# Patient Record
Sex: Female | Born: 2000 | Race: Black or African American | Hispanic: No | Marital: Single | Smoking: Current every day smoker
Health system: Southern US, Community
[De-identification: ages and names within clinical notes are randomized; demographics above are authoritative.]

## PROBLEM LIST (undated history)

## (undated) DIAGNOSIS — F32A Depression, unspecified: Secondary | ICD-10-CM

## (undated) DIAGNOSIS — F419 Anxiety disorder, unspecified: Secondary | ICD-10-CM

## (undated) DIAGNOSIS — F329 Major depressive disorder, single episode, unspecified: Secondary | ICD-10-CM

---
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
## 2019-06-09 ENCOUNTER — Encounter: Payer: Self-pay | Admitting: Family

## 2019-07-01 ENCOUNTER — Other Ambulatory Visit: Payer: Self-pay

## 2019-07-01 ENCOUNTER — Ambulatory Visit (INDEPENDENT_AMBULATORY_CARE_PROVIDER_SITE_OTHER): Payer: Managed Care, Other (non HMO) | Admitting: Family

## 2019-07-01 ENCOUNTER — Other Ambulatory Visit (HOSPITAL_COMMUNITY)
Admission: RE | Admit: 2019-07-01 | Discharge: 2019-07-01 | Disposition: A | Discharge status: Home / Self Care | Payer: Managed Care, Other (non HMO) | Source: Ambulatory Visit | Attending: Family | Admitting: Family

## 2019-07-01 ENCOUNTER — Encounter: Payer: Self-pay | Admitting: Family

## 2019-07-01 VITALS — BP 133/77 | HR 69 | Temp 98.5°F | Resp 16 | Wt 249.0 lb

## 2019-07-01 DIAGNOSIS — Z720 Tobacco use: Secondary | ICD-10-CM

## 2019-07-01 DIAGNOSIS — Z7251 High risk heterosexual behavior: Secondary | ICD-10-CM | POA: Insufficient documentation

## 2019-07-01 DIAGNOSIS — Z309 Encounter for contraceptive management, unspecified: Secondary | ICD-10-CM | POA: Diagnosis not present

## 2019-07-01 DIAGNOSIS — Z0000 Encounter for general adult medical examination without abnormal findings: Secondary | ICD-10-CM | POA: Diagnosis not present

## 2019-07-01 DIAGNOSIS — Z23 Encounter for immunization: Secondary | ICD-10-CM

## 2019-07-01 LAB — POC URINALSYSI DIPSTICK (AUTOMATED)
Blood, UA: NEGATIVE
Glucose, UA: NEGATIVE
Ketones, UA: NEGATIVE
Leukocytes, UA: NEGATIVE
Nitrite, UA: NEGATIVE
Protein, UA: POSITIVE — AB
Spec Grav, UA: >=1.03 — AB (ref 1.010–1.025)
Urobilinogen, UA: 1 E.U./dL
pH, UA: 6 (ref 5.0–8.0)

## 2019-07-01 LAB — POCT URINE PREGNANCY: Preg Test, Ur: NEGATIVE

## 2019-07-01 MED ORDER — NORETHIN ACE-ETH ESTRAD-FE 1.5-30 MG-MCG PO TABS: 1.0000 | ORAL_TABLET | Freq: Every day | ORAL | 11 refills | Status: DC

## 2019-07-01 NOTE — Patient Instructions (Signed)
Please work on diet/exercise and weight loss. Begin birth control tonight. It is very important that you quit smoking.

## 2019-07-01 NOTE — Progress Notes (Signed)
Subjective:    Patient ID: Paula Gross, female    DOB: 2001/09/20, 18 y.o.   MRN: [id number]  HPI  Patient is a 18 yr old female who presents today   Patient presents today for complete physical.  Immunizations: tdap due Diet: reports poor diet Reports that she skips breakfast Lunch: mcd[employer] cheesburger and soda Dinner: sometimes cooks at home, some evening snacks 1-2 days  At work she drinks water, frappe from [employer], juice and soda.  Reports 5 cups of juice a day (minute maid fruit punch), soda 3 cups a day  Exercise: no formal exercise   Interested in OCP- has one female partner, uses condoms "sometimes." LMP 6/15-6/19   Wt Readings from Last 3 Encounters:  07/01/19 249 lb (112.9 kg) (>99 %, Z= 2.51)*  08/11/17 256 lb (116.1 kg) (>99 %, Z= 2.49)*  05/09/16 233 lb (105.7 kg) (>99 %, Z= 2.36)*   * Growth percentiles are based on CDC (Girls, 2-20 Years) data.   Depression- patient is being treated by psychiatry and notes that her depression is stable.  Anxiety- fair, still "working on it."     Review of Systems  Constitutional: Negative for unexpected weight change.  HENT: Negative for hearing loss and rhinorrhea.   Respiratory: Negative for cough and shortness of breath.   Cardiovascular: Negative for chest pain.  Genitourinary: Negative for dysuria, frequency, hematuria and menstrual problem.  Musculoskeletal: Negative for arthralgias.  Skin: Negative for rash.  Neurological: Negative for headaches.  Hematological: Negative for adenopathy.    Past Medical History:  Diagnosis Date  . Anxiety   . Depression      Social History   Socioeconomic History  . Marital status: Single    Spouse name: Not on file  . Number of children: Not on file  . Years of education: Not on file  . Highest education level: Not on file  Occupational History  . Not on file  Social Needs  . Financial resource strain: Not on file  . Food insecurity    Worry: Not on  file    Inability: Not on file  . Transportation needs    Medical: Not on file    Non-medical: Not on file  Tobacco Use  . Smoking status: Current Every Day Smoker    Packs/day: 0.25    Types: Cigarettes  . Smokeless tobacco: Never Used  Substance and Sexual Activity  . Alcohol use: No    Alcohol/week: 0.0 standard drinks  . Drug use: No  . Sexual activity: Never  Lifestyle  . Physical activity    Days per week: Not on file    Minutes per session: Not on file  . Stress: Not on file  Relationships  . Social Herbalist on phone: Not on file    Gets together: Not on file    Attends religious service: Not on file    Active member of club or organization: Not on file    Attends meetings of clubs or organizations: Not on file    Relationship status: Not on file  . Intimate partner violence    Fear of current or ex partner: Not on file    Emotionally abused: Not on file    Physically abused: Not on file    Forced sexual activity: Not on file  Other Topics Concern  . Not on file  Social History Narrative   Lives with Mom   Dad ( has "rocky" relationship with her dad  who lives locally)   1 half brother (does not liver with her)   Goes to [employer]- [employer]   Wants to go to college   Dog- mutt   Enjoys reading, television, drawing    No past surgical history on file.  Family History  Problem Relation Age of Onset  . Arthritis Mother   . Diabetes Mother   . Hyperlipidemia Mother   . Hypertension Mother   . Depression Mother   . Arthritis Maternal Grandmother   . Cancer Maternal Grandmother        colon  . Hyperlipidemia Maternal Grandmother     No Known Allergies  Current Outpatient Medications on File Prior to Visit  Medication Sig Dispense Refill  . buPROPion (WELLBUTRIN XL) 150 MG 24 hr tablet Take 1 tablet (150 mg total) by mouth daily. For depression 30 tablet 0  . hydrOXYzine (ATARAX/VISTARIL) 25 MG tablet Take 1 tablet (25 mg total) by  mouth 3 (three) times daily as needed for anxiety. 60 tablet 0  . sertraline (ZOLOFT) 100 MG tablet Take 100 mg by mouth daily.    . traZODone (DESYREL) 50 MG tablet Take 1 tablet (50 mg total) by mouth at bedtime as needed for sleep. 30 tablet 0   No current facility-administered medications on file prior to visit.     BP 133/77 (BP Location: Right Arm, Patient Position: Sitting, Cuff Size: Large)   Pulse 69   Temp 98.5 F (36.9 C) (Oral)   Resp 16   Wt 249 lb (112.9 kg)   SpO2 98%   BMI 41.44 kg/m       Objective:   Physical Exam   Physical Exam  Constitutional: She is oriented to person, place, and time. She appears well-developed and well-nourished. No distress.  HENT:  Head: Normocephalic and atraumatic.  Right Ear: Tympanic membrane and ear canal normal.  Left Ear: Tympanic membrane and ear canal normal.  Mouth/Throat: not examined- pt wearing mask due to COVID-19 pandemic Eyes: Pupils are equal, round, and reactive to light. No scleral icterus.  Neck: Normal range of motion. No thyromegaly present.  Cardiovascular: Normal rate and regular rhythm.   No murmur heard. Pulmonary/Chest: Effort normal and breath sounds normal. No respiratory distress. He has no wheezes. She has no rales. She exhibits no tenderness.  Abdominal: Soft. Bowel sounds are normal. She exhibits no distension and no mass. There is no tenderness. There is no rebound and no guarding.  Musculoskeletal: She exhibits no edema.  Lymphadenopathy:    She has no cervical adenopathy.  Neurological: She is alert and oriented to person, place, and time. She has normal patellar reflexes. She exhibits normal muscle tone. Coordination normal.  Skin: Skin is warm and dry.  Psychiatric: She has a normal mood and affect. Her behavior is normal. Judgment and thought content normal.  Breast/pelvis: deferred         Assessment & Plan:   Preventative care- discussed healthy diet (avoiding sugared beverages, not  skipping meals, increase lean proteins fresh fruits/veggies). Check routine labs as well as GC/Chlamydia and HIV.   Recommended that she add 30 minutes of walking 5 days a week.    Contraceptive management- urine hcg today, if negative start OCP tonight. Also understands that concomitant smoking can increase her stroke risk. Discussed importance of always using condoms.   Tobacco abuse- pt counseled on importance of quitting smoking.         Assessment & Plan:

## 2019-07-02 LAB — URINE CYTOLOGY ANCILLARY ONLY
Chlamydia: POSITIVE — AB
Neisseria Gonorrhea: NEGATIVE

## 2019-07-04 ENCOUNTER — Telehealth: Payer: Self-pay | Admitting: Family

## 2019-07-04 DIAGNOSIS — R809 Proteinuria, unspecified: Secondary | ICD-10-CM

## 2019-07-04 NOTE — Telephone Encounter (Signed)
Please contact pt and let her know that her lab work was positive for chlamydia. I would like her to to come in for nurse visit to receive azithromycin 1000mg  PO x 1. She also should notify her partner(s) so that they can also receive treatment.  I encourage that she use condoms all of the time.    Urine pregnancy test was negative, ok to start oral contraceptive if she has not already.    Note was made of protein in her urine.  I Would like for her to repeat urinalysis when she comes for the nurse visit. I have placed the order.

## 2019-07-06 NOTE — Telephone Encounter (Signed)
Patient was advised of results and scheduled to be here 07-08-19 for nurse visit

## 2019-07-08 ENCOUNTER — Ambulatory Visit: Payer: Managed Care, Other (non HMO)

## 2019-07-08 ENCOUNTER — Other Ambulatory Visit: Payer: Self-pay

## 2019-07-08 DIAGNOSIS — A749 Chlamydial infection, unspecified: Secondary | ICD-10-CM

## 2019-07-08 MED ORDER — AZITHROMYCIN 250 MG PO TABS: 500.0000 mg | ORAL_TABLET | Freq: Once | ORAL | Status: DC

## 2019-07-08 NOTE — Progress Notes (Signed)
Patient her today for an oral medication Azythromycin 1000 mg for positive STD, Per Melissa.  Patient swallowed #2,  500mg  tablets with no complications.

## 2019-08-12 ENCOUNTER — Ambulatory Visit (INDEPENDENT_AMBULATORY_CARE_PROVIDER_SITE_OTHER): Payer: Managed Care, Other (non HMO) | Admitting: Medical

## 2019-08-12 ENCOUNTER — Ambulatory Visit (HOSPITAL_COMMUNITY)
Admission: RE | Admit: 2019-08-12 | Discharge: 2019-08-12 | Disposition: A | Discharge status: Home / Self Care | Payer: Managed Care, Other (non HMO) | Attending: Psychiatry | Admitting: Psychiatry

## 2019-08-12 ENCOUNTER — Encounter: Payer: Self-pay | Admitting: Medical

## 2019-08-12 ENCOUNTER — Other Ambulatory Visit: Payer: Self-pay

## 2019-08-12 ENCOUNTER — Telehealth: Payer: Self-pay | Admitting: Family

## 2019-08-12 VITALS — BP 140/79 | Ht 65.0 in | Wt 251.6 lb

## 2019-08-12 DIAGNOSIS — R4586 Emotional lability: Secondary | ICD-10-CM

## 2019-08-12 NOTE — Telephone Encounter (Signed)
Error

## 2019-08-12 NOTE — Progress Notes (Signed)
Subjective:    Patient ID: Paula Gross, female    DOB: [birth year], 18 y.o.   MRN: [id number]  HPI  Virtual Visit via Video Note(initial video visit but then lost audio and finished by phone)  I connected with Dorann LodgeJuanita E Balgobin on 08/12/19 at  2:40 PM EDT by a video enabled telemedicine application and verified that I am speaking with the correct person using two identifiers.  Location: Patient: home Provider: office   Pt did not check vitals today. Pt thinks her mom might have bp cuff. Not sure if blood pressure correct as reading came back 72/57 initially.   I discussed the limitations of evaluation and management by telemedicine and the availability of in person appointments. The patient expressed understanding and agreed to proceed.  History of Present Illness:  Pt has been using ocp recently. Her mood has worsened recently and she has hx of depression. On review of chart in 2018 she had severe depression with suicidal ideation in the past.  Pt states recently started ocp in June. Pt states this is her first time starting with ocp.  Pt states that since then her mood has decreased a lot. She states very sad and crying easily. Started 2-3 days ago. Pt states no particular sad or stressful event that has occurred.  Pt in past pt has been on wellbutrin 150 mg, hydroxzine  and trazadone. Pt states has not seen psychiatrist for past 5 months. Pt states has mild depression about 2 days out of 7 days  for past 6 or more months. Now mood is worsened.  Pt thinks that sertraline helped mood better in the past.  Pt states depression is severe presently. No plans for hurting self but transient thoughts of harm to self. But no thoughts of harm to others. Pt shared with mom last night.      Observations/Objective: General-no acute distress, pleasant, oriented.Does not appear overtly sad during interview Lungs- on inspection lungs appear unlabored. Neck- no tracheal deviation or jvd on  inspection. Neuro- gross motor function appears intact.  Assessment and Plan: You do have history of severe depression in the past with reported mild level depression over the past 6 months.  You have been under psychiatrist care and have done relatively well by your description.  However some concern with drastic mood changes over the past 2 to 3 days.  You associate this with your current contraceptives that you  started about 1 month ago but based on your history I am not convinced this is the cause of mood change.  You reported some transient thoughts of harm to self over the last couple of days but have made no  plans.  I am glad that you informed your mother regarding these thoughts.  We need to follow you closely and make sure that you improved.  I do think your psychiatrist office needs to be fully aware of how you feel.  On review of your medical history and medications that you are on, I think you might need to have incremental increase of your Wellbutrin or you might need to add on low-dose sertraline and as you report that that did help you in the past.  However I think it is most appropriate that psychiatrist make this decision.  In addition psychiatrist might see you tomorrow under recent circumstances.  You authorized me to discuss the above with your mother today.  If you have persistent thoughts of harming your self or you start to formulate  plans then strongly recommend/advised that you be seen at [facility] emergency department for further evaluation.  I will update your PCP on your recent change of mood.  We will see if she wants to change your contraceptive.  Asked mom and patient to update me tomorrow by my chart or by calling office directly as to whether psychiatrist made medication changes or agreed to see her.  If there is a delay in response from psychiatrist office then might need to make medication change or might need to advise evaluation [facility] ED.  Follow-up date to  be determined depending on psychiatrist response.  Esperanza RichtersEdward Jahrel Borthwick, PA-C  Follow Up Instructions:    I discussed the assessment and treatment plan with the patient. The patient was provided an opportunity to ask questions and all were answered. The patient agreed with the plan and demonstrated an understanding of the instructions.   The patient was advised to call back or seek an in-person evaluation if the symptoms worsen or if the condition fails to improve as anticipated.  I provided  25  + minutes of non-face-to-face time during this encounter.   Esperanza RichtersEdward Cowan Pilar, PA-C   Review of Systems  Constitutional: Negative for chills, fatigue and fever.  Respiratory: Negative for cough, chest tightness, shortness of breath and wheezing.   Cardiovascular: Negative for chest pain and palpitations.  Neurological: Negative for dizziness, weakness and numbness.  Hematological: Negative for adenopathy. Does not bruise/bleed easily.  Psychiatric/Behavioral: Positive for dysphoric mood. Negative for agitation, behavioral problems, self-injury and sleep disturbance. The patient is not hyperactive.        Transient thought of harm to self. See hpi    Past Medical History:  Diagnosis Date  . Anxiety   . Depression      Social History   Socioeconomic History  . Marital status: Single    Spouse name: Not on file  . Number of children: Not on file  . Years of education: Not on file  . Highest education level: Not on file  Occupational History  . Not on file  Social Needs  . Financial resource strain: Not on file  . Food insecurity    Worry: Not on file    Inability: Not on file  . Transportation needs    Medical: Not on file    Non-medical: Not on file  Tobacco Use  . Smoking status: Current Every Day Smoker    Packs/day: 0.25    Types: Cigarettes  . Smokeless tobacco: Never Used  Substance and Sexual Activity  . Alcohol use: No    Alcohol/week: 0.0 standard drinks  . Drug use:  No  . Sexual activity: Never  Lifestyle  . Physical activity    Days per week: Not on file    Minutes per session: Not on file  . Stress: Not on file  Relationships  . Social Musicianconnections    Talks on phone: Not on file    Gets together: Not on file    Attends religious service: Not on file    Active member of club or organization: Not on file    Attends meetings of clubs or organizations: Not on file    Relationship status: Not on file  . Intimate partner violence    Fear of current or ex partner: Not on file    Emotionally abused: Not on file    Physically abused: Not on file    Forced sexual activity: Not on file  Other Topics Concern  .  Not on file  Social History Narrative   Lives with Mom   Dad ( has "rocky" relationship with her dad who lives locally)   1 half brother (does not liver with her)   Goes to [place]   Wants to go to college   Dog- mutt   Enjoys reading, television, drawing    No past surgical history on file.  Family History  Problem Relation Age of Onset  . Arthritis Mother   . Diabetes Mother   . Hyperlipidemia Mother   . Hypertension Mother   . Depression Mother   . Arthritis Maternal Grandmother   . Cancer Maternal Grandmother        colon  . Hyperlipidemia Maternal Grandmother     No Known Allergies  Current Outpatient Medications on File Prior to Visit  Medication Sig Dispense Refill  . buPROPion (WELLBUTRIN XL) 150 MG 24 hr tablet Take 1 tablet (150 mg total) by mouth daily. For depression 30 tablet 0  . hydrOXYzine (ATARAX/VISTARIL) 25 MG tablet Take 1 tablet (25 mg total) by mouth 3 (three) times daily as needed for anxiety. 60 tablet 0  . norethindrone-ethinyl estradiol-iron (MICROGESTIN FE 1.5/30) 1.5-30 MG-MCG tablet Take 1 tablet by mouth daily. 1 Package 11  . sertraline (ZOLOFT) 100 MG tablet Take 100 mg by mouth daily.    . traZODone (DESYREL) 50 MG tablet Take 1 tablet (50 mg total) by mouth at bedtime as  needed for sleep. 30 tablet 0   Current Facility-Administered Medications on File Prior to Visit  Medication Dose Route Frequency Provider Last Rate Last Dose  . azithromycin (ZITHROMAX) tablet 500 mg  500 mg Oral Once Debbrah Alar, NP      . azithromycin (ZITHROMAX) tablet 500 mg  500 mg Oral Once Debbrah Alar, NP        BP 140/79   Ht 5\' 5"  (1.651 m)   Wt 251 lb 9.6 oz (114.1 kg)   BMI 41.87 kg/m       Objective:   Physical Exam        Assessment & Plan:

## 2019-08-12 NOTE — Patient Instructions (Addendum)
You do have history of severe depression in the past with reported mild level depression over the past 6 months.  You have been under psychiatrist care and have done relatively well by your description.  However some concern with drastic mood changes over the past 2 to 3 days.  You associate this with your current contraceptives that you  started about 1 month ago but based on your history I am not convinced this is the cause of mood change.  You reported some transient thoughts of harm to self over the last couple of days but have made no  plans.  I am glad that you informed your mother regarding these thoughts.  We need to follow you closely and make sure that you improved.  I do think your psychiatrist office needs to be fully aware of how you feel.  On review of your medical history and medications that you are on, I think you might need to have incremental increase of your Wellbutrin or you might need to add on low-dose sertraline and as you report that that did help you in the past.  However I think it is most appropriate that psychiatrist make this decision.  In addition psychiatrist might see you tomorrow under recent circumstances.  You authorized me to discuss the above with your mother today.  If you have persistent thoughts of harming your self or you start to formulate plans then strongly recommend/advised that you be seen at [facility] emergency department for further evaluation.  I will update your PCP on your recent change of mood.  We will see if she wants to change your contraceptive.  Asked mom and patient to update me tomorrow by my chart or by calling office directly as to whether psychiatrist made medication changes or agreed to see her.  If there is a delay in response from psychiatrist office then might need to make medication change or might need to advise evaluation [facility] ED.  Follow-up date to be determined depending on psychiatrist response.

## 2019-08-13 ENCOUNTER — Telehealth: Payer: Self-pay | Admitting: Family

## 2019-08-13 NOTE — Telephone Encounter (Signed)
Please contact pt to schedule an OV to discuss her contraception.  We can do a virtual visit if she wants.

## 2019-08-13 NOTE — Telephone Encounter (Signed)
Patient was scheduled to come in tomorrow at 11 am

## 2019-08-14 ENCOUNTER — Encounter: Payer: Self-pay | Admitting: Family

## 2019-08-14 ENCOUNTER — Ambulatory Visit (INDEPENDENT_AMBULATORY_CARE_PROVIDER_SITE_OTHER): Payer: Managed Care, Other (non HMO) | Admitting: Family

## 2019-08-14 ENCOUNTER — Other Ambulatory Visit: Payer: Self-pay

## 2019-08-14 VITALS — BP 126/81 | HR 96 | Temp 96.6°F | Resp 16 | Ht 65.0 in | Wt 252.6 lb

## 2019-08-14 DIAGNOSIS — Z309 Encounter for contraceptive management, unspecified: Secondary | ICD-10-CM

## 2019-08-14 LAB — POCT URINE PREGNANCY: Preg Test, Ur: NEGATIVE

## 2019-08-14 NOTE — Patient Instructions (Signed)
Please schedule your appointment with GYN. Go to ER if you develop recurrent thoughts of hurting yourself or others. Keep your upcoming appointment with psychiatry.

## 2019-08-14 NOTE — Progress Notes (Signed)
Subjective:    Patient ID: Paula Gross, female    DOB: [birth year], 18 y.o.   MRN: [id number]  HPI  Patient is a 18 yr old female who presents today to discuss contraceptive management.  She saw my colleague 2 days ago due to complaint of mood change.  She has a history of anxiety and depression and is followed by psychiatry.  Last visit we prescribed her Microgestin for contraception.  She reports that she waited several weeks before she started it but then began the oral contraceptive.  Near the end of the first week of OCP she developed some anger/depression/easily irritable.  "I just want ot be alone."  She did have some transient thoughts of suicide without plan.  Today she is feeling "a little bit better, still has some anger /emotions but not as extreme as they were.  Denies current thoughts of SI/HI.    She [facility]  She last saw Paula towards the end of last year.  She has an upcoming appointment with her on 9/3.   She is not currently sexually active.  Though she would like ongoing contraception in the case that she does become sexually active in the near future.   Review of Systems See HPI  Past Medical History:  Diagnosis Date  . Anxiety   . Depression      Social History   Socioeconomic History  . Marital status: Single    Spouse name: Not on file  . Number of children: Not on file  . Years of education: Not on file  . Highest education level: Not on file  Occupational History  . Not on file  Social Needs  . Financial resource strain: Not on file  . Food insecurity    Worry: Not on file    Inability: Not on file  . Transportation needs    Medical: Not on file    Non-medical: Not on file  Tobacco Use  . Smoking status: Current Every Day Smoker    Packs/day: 0.25    Types: Cigarettes  . Smokeless tobacco: Never Used  Substance and Sexual Activity  . Alcohol use: No    Alcohol/week: 0.0 standard drinks  . Drug  use: No  . Sexual activity: Never  Lifestyle  . Physical activity    Days per week: Not on file    Minutes per session: Not on file  . Stress: Not on file  Relationships  . Social Musicianconnections    Talks on phone: Not on file    Gets together: Not on file    Attends religious service: Not on file    Active member of club or organization: Not on file    Attends meetings of clubs or organizations: Not on file    Relationship status: Not on file  . Intimate partner violence    Fear of current or ex partner: Not on file    Emotionally abused: Not on file    Physically abused: Not on file    Forced sexual activity: Not on file  Other Topics Concern  . Not on file  Social History Narrative   Lives with Mom   Dad ( has "rocky" relationship with her dad who lives locally)   1 half brother (does not liver with her)   Goes to [employer]   Wants to go to college   Dog- mutt   Enjoys reading, television, drawing    No past surgical history  on file.  Family History  Problem Relation Age of Onset  . Arthritis Mother   . Diabetes Mother   . Hyperlipidemia Mother   . Hypertension Mother   . Depression Mother   . Arthritis Maternal Grandmother   . Cancer Maternal Grandmother        colon  . Hyperlipidemia Maternal Grandmother     No Known Allergies  Current Outpatient Medications on File Prior to Visit  Medication Sig Dispense Refill  . buPROPion (WELLBUTRIN XL) 150 MG 24 hr tablet Take 1 tablet (150 mg total) by mouth daily. For depression 30 tablet 0  . hydrOXYzine (ATARAX/VISTARIL) 25 MG tablet Take 1 tablet (25 mg total) by mouth 3 (three) times daily as needed for anxiety. 60 tablet 0  . norethindrone-ethinyl estradiol-iron (MICROGESTIN FE 1.5/30) 1.5-30 MG-MCG tablet Take 1 tablet by mouth daily. 1 Package 11  . sertraline (ZOLOFT) 100 MG tablet Take 100 mg by mouth daily.    . traZODone (DESYREL) 50 MG tablet Take 1 tablet (50 mg total) by mouth at bedtime  as needed for sleep. 30 tablet 0   Current Facility-Administered Medications on File Prior to Visit  Medication Dose Route Frequency Provider Last Rate Last Dose  . azithromycin (ZITHROMAX) tablet 500 mg  500 mg Oral Once Sandford Craze'Sullivan, Jacorion Klem, NP      . azithromycin (ZITHROMAX) tablet 500 mg  500 mg Oral Once Sandford Craze'Sullivan, Lyda Colcord, NP        BP 126/81 (BP Location: Right Arm, Patient Position: Sitting, Cuff Size: Large)   Pulse 96   Temp (!) 96.6 F (35.9 C) (Temporal)   Resp 16   Ht 5\' 5"  (1.651 m)   Wt 252 lb 9.6 oz (114.6 kg)   SpO2 100%   BMI 42.03 kg/m       Objective:   Physical Exam Constitutional:      General: She is not in acute distress.    Appearance: Normal appearance. She is obese.  Neurological:     Mental Status: She is alert.  Psychiatric:        Attention and Perception: Attention and perception normal.        Mood and Affect: Affect is flat.        Speech: Speech normal.        Behavior: Behavior normal. Behavior is cooperative.        Thought Content: Thought content normal.        Cognition and Memory: Cognition and memory normal.        Judgment: Judgment normal.           Assessment & Plan:  Depression/anxiety-her anxiety seems overall well controlled.  Depression is an ongoing issue but she is doing better today than she was previously.  We discussed at length various contraception options.  We discussed the possibility of discontinuing her oral contraceptive until we can get her into GYN to discuss alternate methods such as an IUD.  I have concerned about giving her Depo  in case this worsens her depression and we cannot undo the effects for 3 months.  She does not wish to discontinue the oral contraceptive at this time until she has another stable form of birth control and place.  We will work to try to get her into GYN as soon as possible.  In the meantime she understands that should she have recurrent thoughts of hurting herself or others she  should call 911 she is advised to keep her upcoming appointment with  psychiatry.  Contraceptive management- refer to GYN.   A total of 15  minutes were spent face-to-face with the patient during this encounter and over half of that time was spent on counseling and coordination of care. The patient was counseled on Depression/anxiety/contraceptive management.

## 2019-09-02 ENCOUNTER — Ambulatory Visit: Payer: Managed Care, Other (non HMO) | Admitting: Advanced Practice Midwife

## 2019-09-04 DIAGNOSIS — F332 Major depressive disorder, recurrent severe without psychotic features: Secondary | ICD-10-CM | POA: Diagnosis not present

## 2019-09-04 DIAGNOSIS — F4010 Social phobia, unspecified: Secondary | ICD-10-CM | POA: Diagnosis not present

## 2019-09-04 DIAGNOSIS — F411 Generalized anxiety disorder: Secondary | ICD-10-CM | POA: Diagnosis not present

## 2019-09-18 ENCOUNTER — Ambulatory Visit: Payer: Managed Care, Other (non HMO)

## 2019-09-30 DIAGNOSIS — F411 Generalized anxiety disorder: Secondary | ICD-10-CM | POA: Diagnosis not present

## 2019-09-30 DIAGNOSIS — F4010 Social phobia, unspecified: Secondary | ICD-10-CM | POA: Diagnosis not present

## 2019-09-30 DIAGNOSIS — F332 Major depressive disorder, recurrent severe without psychotic features: Secondary | ICD-10-CM | POA: Diagnosis not present

## 2019-11-13 ENCOUNTER — Other Ambulatory Visit: Payer: Self-pay

## 2019-11-13 ENCOUNTER — Ambulatory Visit (INDEPENDENT_AMBULATORY_CARE_PROVIDER_SITE_OTHER): Payer: Managed Care, Other (non HMO) | Admitting: Family

## 2019-11-13 DIAGNOSIS — U071 COVID-19: Secondary | ICD-10-CM | POA: Diagnosis not present

## 2019-11-13 NOTE — Progress Notes (Signed)
Virtual Visit via Video Note  I connected with Paula Gross on 11/13/19 at 10:20 AM EST by a video enabled telemedicine application and verified that I am speaking with the correct person using two identifiers.  Location: Patient: home Provider: office   I discussed the limitations of evaluation and management by telemedicine and the availability of in person appointments. The patient expressed understanding and agreed to proceed.  History of Present Illness:  Patient is a 18 yr old female who presents today due to Covid-19 infection.  Reports she went to a urgent care 3 days ago. She was having loss of taste/smell. She now has nasal congestion, mild cough.  Denies sore throat.  No current headaches. Reports low grade temp, no body aches.  + fatigue.  She is short of breath.    Past Medical History:  Diagnosis Date  . Anxiety   . Depression      Social History   Socioeconomic History  . Marital status: Single    Spouse name: Not on file  . Number of children: Not on file  . Years of education: Not on file  . Highest education level: Not on file  Occupational History  . Not on file  Social Needs  . Financial resource strain: Not on file  . Food insecurity    Worry: Not on file    Inability: Not on file  . Transportation needs    Medical: Not on file    Non-medical: Not on file  Tobacco Use  . Smoking status: Current Every Day Smoker    Packs/day: 0.25    Types: Cigarettes  . Smokeless tobacco: Never Used  Substance and Sexual Activity  . Alcohol use: No    Alcohol/week: 0.0 standard drinks  . Drug use: No  . Sexual activity: Never  Lifestyle  . Physical activity    Days per week: Not on file    Minutes per session: Not on file  . Stress: Not on file  Relationships  . Social Musician on phone: Not on file    Gets together: Not on file    Attends religious service: Not on file    Active member of club or organization: Not on file    Attends  meetings of clubs or organizations: Not on file    Relationship status: Not on file  . Intimate partner violence    Fear of current or ex partner: Not on file    Emotionally abused: Not on file    Physically abused: Not on file    Forced sexual activity: Not on file  Other Topics Concern  . Not on file  Social History Narrative   Lives with Mom   Dad ( has "rocky" relationship with her dad who lives locally)   1 half brother (does not liver with her)   Goes to [employer]   Wants to go to college   Dog- mutt   Enjoys reading, television, drawing    No past surgical history on file.  Family History  Problem Relation Age of Onset  . Arthritis Mother   . Diabetes Mother   . Hyperlipidemia Mother   . Hypertension Mother   . Depression Mother   . Arthritis Maternal Grandmother   . Cancer Maternal Grandmother        colon  . Hyperlipidemia Maternal Grandmother     No Known Allergies  Current Outpatient Medications on File Prior to Visit  Medication Sig  Dispense Refill  . buPROPion (WELLBUTRIN XL) 150 MG 24 hr tablet Take 1 tablet (150 mg total) by mouth daily. For depression 30 tablet 0  . hydrOXYzine (ATARAX/VISTARIL) 25 MG tablet Take 1 tablet (25 mg total) by mouth 3 (three) times daily as needed for anxiety. 60 tablet 0  . norethindrone-ethinyl estradiol-iron (MICROGESTIN FE 1.5/30) 1.5-30 MG-MCG tablet Take 1 tablet by mouth daily. 1 Package 11  . sertraline (ZOLOFT) 100 MG tablet Take 100 mg by mouth daily.    . traZODone (DESYREL) 50 MG tablet Take 1 tablet (50 mg total) by mouth at bedtime as needed for sleep. 30 tablet 0   Current Facility-Administered Medications on File Prior to Visit  Medication Dose Route Frequency Provider Last Rate Last Dose  . azithromycin (ZITHROMAX) tablet 500 mg  500 mg Oral Once Debbrah Alar, NP      . azithromycin (ZITHROMAX) tablet 500 mg  500 mg Oral Once Debbrah Alar, NP        There were no vitals  taken for this visit.    Observations/Objective:   Gen: Awake, alert, no acute distress Resp: Breathing is even and non-labored Psych: calm/pleasant demeanor Neuro: Alert and Oriented x 3, + facial symmetry, speech is clear.   Assessment and Plan:  COVID-19 infection-we discussed supportive measures including Tylenol as needed for body aches/fever and Mucinex as needed for cough/congestion.  She is advised to call if symptoms worsen.  She is advised to go to the emergency room should she develop shortness of breath.  We discussed need to quarantine for 10 days from the start of symptoms and needs to be fever free with symptoms improving for at least 24 hrs.  Follow Up Instructions:    I discussed the assessment and treatment plan with the patient. The patient was provided an opportunity to ask questions and all were answered. The patient agreed with the plan and demonstrated an understanding of the instructions.   The patient was advised to call back or seek an in-person evaluation if the symptoms worsen or if the condition fails to improve as anticipated.  Nance Pear, NP

## 2020-03-25 ENCOUNTER — Ambulatory Visit: Payer: Managed Care, Other (non HMO) | Attending: Internal Medicine

## 2020-03-25 DIAGNOSIS — Z23 Encounter for immunization: Secondary | ICD-10-CM

## 2020-03-25 NOTE — Progress Notes (Signed)
   Covid-19 Vaccination Clinic  Name:  Paula Gross    MRN: [id number] DOB: [birth year]  03/25/2020  [name] was observed post Covid-19 immunization for 15 minutes without incident. She was provided with Vaccine Information Sheet and instruction to access the V-Safe system.   [name] was instructed to call 911 with any severe reactions post vaccine: Marland Kitchen Difficulty breathing  . Swelling of face and throat  . A fast heartbeat  . A bad rash all over body  . Dizziness and weakness   Immunizations Administered    Name Date Dose VIS Date Route   Pfizer COVID-19 Vaccine 03/25/2020  2:51 PM 0.3 mL 12/11/2019 Intramuscular   Manufacturer: [employer], [employer]   Lot: [id number]   NDC: [address]-4

## 2020-04-19 ENCOUNTER — Ambulatory Visit: Payer: Managed Care, Other (non HMO) | Attending: Internal Medicine

## 2020-04-19 DIAGNOSIS — Z23 Encounter for immunization: Secondary | ICD-10-CM

## 2020-04-19 NOTE — Progress Notes (Signed)
   Covid-19 Vaccination Clinic  Name:  Paula Gross    MRN: [id number] DOB: [birth year]  04/19/2020  Ms. Wire was observed post Covid-19 immunization for 15 minutes without incident. She was provided with Vaccine Information Sheet and instruction to access the V-Safe system.   [name] was instructed to call 911 with any severe reactions post vaccine: Marland Kitchen Difficulty breathing  . Swelling of face and throat  . A fast heartbeat  . A bad rash all over body  . Dizziness and weakness   Immunizations Administered    Name Date Dose VIS Date Route   Pfizer COVID-19 Vaccine 04/19/2020  9:52 AM 0.3 mL 02/24/2019 Intramuscular   Manufacturer: [employer], [employer]   Lot: [id number]   NDC: [address]-1

## 2020-07-06 ENCOUNTER — Ambulatory Visit (INDEPENDENT_AMBULATORY_CARE_PROVIDER_SITE_OTHER): Payer: Managed Care, Other (non HMO) | Admitting: Family

## 2020-07-06 ENCOUNTER — Encounter: Payer: Self-pay | Admitting: Family

## 2020-07-06 ENCOUNTER — Other Ambulatory Visit: Payer: Self-pay

## 2020-07-06 VITALS — BP 115/77 | HR 90 | Temp 97.7°F | Resp 16 | Ht 65.0 in | Wt 257.0 lb

## 2020-07-06 DIAGNOSIS — Z0000 Encounter for general adult medical examination without abnormal findings: Secondary | ICD-10-CM

## 2020-07-06 MED ORDER — NORETHIN ACE-ETH ESTRAD-FE 1.5-30 MG-MCG PO TABS: 1.0000 | ORAL_TABLET | Freq: Every day | ORAL | 3 refills | Status: DC

## 2020-07-06 NOTE — Progress Notes (Signed)
Subjective:    Patient ID: Paula Gross, female    DOB: [birth year], 19 y.o.   MRN: [id number]  HPI  Patient presents today for complete physical.  Immunizations: up to date Diet: Trying to work on portions  Weight 257.3  Wt Readings from Last 3 Encounters:  07/06/20 257 lb (116.6 kg)  08/14/19 252 lb 9.6 oz (114.6 kg)  08/12/19 251 lb 9.6 oz (114.1 kg)   Exercise: some, not regularly.  She works 6 days a week.  Pap Smear: Dental: due  Vision: last year.   Depression- sees psychiatry.  Not currently on medicine. She stopped the medication.  Reports depression seems stable currently.   Review of Systems  Constitutional: Negative for unexpected weight change.  HENT: Negative for hearing loss and rhinorrhea.   Eyes: Negative for visual disturbance.  Respiratory: Negative for cough.   Cardiovascular: Negative for leg swelling.  Gastrointestinal: Negative for constipation and diarrhea.  Genitourinary: Negative for dysuria and frequency.  Musculoskeletal: Negative for arthralgias and myalgias.  Skin: Negative for rash.  Neurological: Negative for headaches.  Hematological: Negative for adenopathy.   Past Medical History:  Diagnosis Date  . Anxiety   . Depression      Social History   Socioeconomic History  . Marital status: Single    Spouse name: Not on file  . Number of children: Not on file  . Years of education: Not on file  . Highest education level: Not on file  Occupational History  . Not on file  Tobacco Use  . Smoking status: Former Smoker    Packs/day: 0.25    Types: Cigarettes  . Smokeless tobacco: Former Neurosurgeon    Quit date: 2021  Vaping Use  . Vaping Use: Never used  Substance and Sexual Activity  . Alcohol use: No    Alcohol/week: 0.0 standard drinks  . Drug use: No  . Sexual activity: Yes  Other Topics Concern  . Not on file  Social History Narrative   Lives with Mom   Dad ( has "rocky" relationship with her dad who lives locally)   1  half brother (does not liver with her)   Goes to [employer]- Junior   Wants to go to college   Dog- mutt   Enjoys reading, television, drawing   Social Determinants of Corporate investment banker Strain:   . Difficulty of Paying Living Expenses:   Food Insecurity:   . Worried About Programme researcher, broadcasting/film/video in the Last Year:   . Barista in the Last Year:   Transportation Needs:   . Freight forwarder (Medical):   Marland Kitchen Lack of Transportation (Non-Medical):   Physical Activity:   . Days of Exercise per Week:   . Minutes of Exercise per Session:   Stress:   . Feeling of Stress :   Social Connections:   . Frequency of Communication with Friends and Family:   . Frequency of Social Gatherings with Friends and Family:   . Attends Religious Services:   . Active Member of Clubs or Organizations:   . Attends Banker Meetings:   Marland Kitchen Marital Status:   Intimate Partner Violence:   . Fear of Current or Ex-Partner:   . Emotionally Abused:   Marland Kitchen Physically Abused:   . Sexually Abused:     No past surgical history on file.  Family History  Problem Relation Age of Onset  . Arthritis Mother   . Diabetes Mother   .  Hyperlipidemia Mother   . Hypertension Mother   . Depression Mother   . Arthritis Maternal Grandmother   . Cancer Maternal Grandmother        colon  . Hyperlipidemia Maternal Grandmother     No Known Allergies  Current Outpatient Medications on File Prior to Visit  Medication Sig Dispense Refill  . norethindrone-ethinyl estradiol-iron (MICROGESTIN FE 1.5/30) 1.5-30 MG-MCG tablet Take 1 tablet by mouth daily. 1 Package 11   No current facility-administered medications on file prior to visit.    BP 115/77 (BP Location: Right Arm, Patient Position: Sitting, Cuff Size: Large)   Pulse 90   Temp 97.7 F (36.5 C) (Temporal)   Resp 16   Ht 5\' 5"  (1.651 m)   Wt 257 lb (116.6 kg)   LMP  (LMP Unknown) Comment: on "continuous bc"  SpO2 100%   BMI  42.77 kg/m       Objective:   Physical Exam Physical Exam  Constitutional: She is oriented to person, place, and time. She appears well-developed and well-nourished. No distress.  HENT:  Head: Normocephalic and atraumatic.  Right Ear: Tympanic membrane and ear canal normal.  Left Ear: Tympanic membrane and ear canal normal.  Mouth/Throat: Not examined- pt wearing mask Eyes: Pupils are equal, round, and reactive to light. No scleral icterus.  Neck: Normal range of motion. No thyromegaly present.  Cardiovascular: Normal rate and regular rhythm.   No murmur heard. Pulmonary/Chest: Effort normal and breath sounds normal. No respiratory distress. He has no wheezes. She has no rales. She exhibits no tenderness.  Abdominal: Soft. Bowel sounds are normal. She exhibits no distension and no mass. There is no tenderness. There is no rebound and no guarding.  Musculoskeletal: She exhibits no edema.  Lymphadenopathy:    She has no cervical adenopathy.  Neurological: She is alert and oriented to person, place, and time. She has normal patellar reflexes. She exhibits normal muscle tone. Coordination normal.  Skin: Skin is warm and dry.  Psychiatric: She has a normal mood and affect. Her behavior is normal. Judgment and thought content normal.  Breasts/pelvic: deferred           Assessment & Plan:  Preventative care- discussed diet, exercise and weight loss.  She wishes to complete Pap with her GYN.  Immunizations reviewed and up to date.  Will obtain routine lab work.   This visit occurred during the SARS-CoV-2 public health emergency.  Safety protocols were in place, including screening questions prior to the visit, additional usage of staff PPE, and extensive cleaning of exam room while observing appropriate contact time as indicated for disinfecting solutions.         Assessment & Plan:

## 2020-07-06 NOTE — Patient Instructions (Addendum)
Please follow up with psychiatry.   Please work on adding regular exercise, portion control and weight loss.    Preventive Care 59-21 Years Old, Female Preventive care refers to lifestyle choices and visits with your health care provider that can promote health and wellness. At this stage in your life, you may start seeing a primary care physician instead of a pediatrician. Your health care is now your responsibility. Preventive care for young adults includes:  A yearly physical exam. This is also called an annual wellness visit.  Regular dental and eye exams.  Immunizations.  Screening for certain conditions.  Healthy lifestyle choices, such as diet and exercise. What can I expect for my preventive care visit? Physical exam Your health care provider may check:  Height and weight. These may be used to calculate body mass index (BMI), which is a measurement that tells if you are at a healthy weight.  Heart rate and blood pressure.  Body temperature. Counseling Your health care provider may ask you questions about:  Past medical problems and family medical history.  Alcohol, tobacco, and drug use.  Home and relationship well-being.  Access to firearms.  Emotional well-being.  Diet, exercise, and sleep habits.  Sexual activity and sexual health.  Method of birth control.  Menstrual cycle.  Pregnancy history. What immunizations do I need?  Influenza (flu) vaccine  This is recommended every year. Tetanus, diphtheria, and pertussis (Tdap) vaccine  You may need a Td booster every 10 years. Varicella (chickenpox) vaccine  You may need this vaccine if you have not already been vaccinated. Human papillomavirus (HPV) vaccine  If recommended by your health care provider, you may need three doses over 6 months. Measles, mumps, and rubella (MMR) vaccine  You may need at least one dose of MMR. You may also need a second dose. Meningococcal conjugate (MenACWY)  vaccine  One dose is recommended if you are 18-105 years old and a Market researcher living in a residence hall, or if you have one of several medical conditions. You may also need additional booster doses. Pneumococcal conjugate (PCV13) vaccine  You may need this if you have certain conditions and were not previously vaccinated. Pneumococcal polysaccharide (PPSV23) vaccine  You may need one or two doses if you smoke cigarettes or if you have certain conditions. Hepatitis A vaccine  You may need this if you have certain conditions or if you travel or work in places where you may be exposed to hepatitis A. Hepatitis B vaccine  You may need this if you have certain conditions or if you travel or work in places where you may be exposed to hepatitis B. Haemophilus influenzae type b (Hib) vaccine  You may need this if you have certain risk factors. You may receive vaccines as individual doses or as more than one vaccine together in one shot (combination vaccines). Talk with your health care provider about the risks and benefits of combination vaccines. What tests do I need? Blood tests  Lipid and cholesterol levels. These may be checked every 5 years starting at age 29.  Hepatitis C test.  Hepatitis B test. Screening  Pelvic exam and Pap test. This may be done every 3 years starting at age 33.  Sexually transmitted disease (STD) testing, if you are at risk.  BRCA-related cancer screening. This may be done if you have a family history of breast, ovarian, tubal, or peritoneal cancers. Other tests  Tuberculosis skin test.  Vision and hearing tests.  Skin exam.  Breast exam. Follow these instructions at home: Eating and drinking   Eat a diet that includes fresh fruits and vegetables, whole grains, lean protein, and low-fat dairy products.  Drink enough fluid to keep your urine pale yellow.  Do not drink alcohol if: ? Your health care provider tells you not to  drink. ? You are pregnant, may be pregnant, or are planning to become pregnant. ? You are under the legal drinking age. In the [place] the legal drinking age is 38.  If you drink alcohol: ? Limit how much you have to 0-1 drink a day. ? Be aware of how much alcohol is in your drink. In the [place] one drink equals one 12 oz bottle of beer (355 mL), one 5 oz glass of wine (148 mL), or one 1 oz glass of hard liquor (44 mL). Lifestyle  Take daily care of your teeth and gums.  Stay active. Exercise at least 30 minutes 5 or more days of the week.  Do not use any products that contain nicotine or tobacco, such as cigarettes, e-cigarettes, and chewing tobacco. If you need help quitting, ask your health care provider.  Do not use drugs.  If you are sexually active, practice safe sex. Use a condom or other form of birth control (contraception) in order to prevent pregnancy and STIs (sexually transmitted infections). If you plan to become pregnant, see your health care provider for a pre-conception visit.  Find healthy ways to cope with stress, such as: ? Meditation, yoga, or listening to music. ? Journaling. ? Talking to a trusted person. ? Spending time with friends and family. Safety  Always wear your seat belt while driving or riding in a vehicle.  Do not drive if you have been drinking alcohol. Do not ride with someone who has been drinking.  Do not drive when you are tired or distracted. Do not text while driving.  Wear a helmet and other protective equipment during sports activities.  If you have firearms in your house, make sure you follow all gun safety procedures.  Seek help if you have been bullied, physically abused, or sexually abused.  Use the Internet responsibly to avoid dangers such as online bullying and online sex predators. What's next?  Go to your health care provider once a year for a well check visit.  Ask your health care provider how often you should have your  eyes and teeth checked.  Stay up to date on all vaccines. This information is not intended to replace advice given to you by your health care provider. Make sure you discuss any questions you have with your health care provider. Document Revised: 12/11/2018 Document Reviewed: 12/11/2018 Elsevier Patient Education  2020 [employer].

## 2021-02-28 HISTORY — PX: OTHER SURGICAL HISTORY: SHX169

## 2021-06-13 ENCOUNTER — Other Ambulatory Visit: Payer: Self-pay | Admitting: Family

## 2021-07-10 ENCOUNTER — Encounter: Payer: Managed Care, Other (non HMO) | Admitting: Family

## 2021-07-11 ENCOUNTER — Ambulatory Visit (INDEPENDENT_AMBULATORY_CARE_PROVIDER_SITE_OTHER): Payer: Managed Care, Other (non HMO) | Admitting: Family

## 2021-07-11 ENCOUNTER — Other Ambulatory Visit: Payer: Self-pay

## 2021-07-11 ENCOUNTER — Encounter: Payer: Self-pay | Admitting: Family

## 2021-07-11 VITALS — BP 116/80 | HR 89 | Temp 98.8°F | Resp 18 | Ht 65.0 in | Wt 261.2 lb

## 2021-07-11 DIAGNOSIS — F332 Major depressive disorder, recurrent severe without psychotic features: Secondary | ICD-10-CM

## 2021-07-11 DIAGNOSIS — R110 Nausea: Secondary | ICD-10-CM | POA: Diagnosis not present

## 2021-07-11 DIAGNOSIS — Z0000 Encounter for general adult medical examination without abnormal findings: Secondary | ICD-10-CM | POA: Diagnosis not present

## 2021-07-11 DIAGNOSIS — Z309 Encounter for contraceptive management, unspecified: Secondary | ICD-10-CM | POA: Insufficient documentation

## 2021-07-11 MED ORDER — NORETHIN ACE-ETH ESTRAD-FE 1.5-30 MG-MCG PO TABS: 1.0000 | ORAL_TABLET | Freq: Every day | ORAL | 2 refills | Status: DC

## 2021-07-11 MED ORDER — OMEPRAZOLE 40 MG PO CPDR
40.0000 mg | DELAYED_RELEASE_CAPSULE | Freq: Every day | ORAL | 3 refills | Status: DC
Start: 2021-07-11 — End: 2021-11-20

## 2021-07-11 NOTE — Assessment & Plan Note (Addendum)
Wt Readings from Last 3 Encounters:  07/11/21 261 lb 3.2 oz (118.5 kg)  07/06/20 257 lb (116.6 kg)  08/14/19 252 lb 9.6 oz (114.6 kg)   Discussed healthy diet, exercise and weight loss. Pap will be done with GYN.

## 2021-07-11 NOTE — Patient Instructions (Addendum)
Work on cutting out juice, adding regular exercise such as walking 30 minutes 5 days a week. Keep your appointment with the GYN for pap. Start omeprazole 40mg  once daily (antacid medication) Please schedule an appointment with a psychiatrist. See some numbers below.   Psychiatric Services:  [facility]) - [phone] [facility] [place]) - [phone] Dr. [phone] [facility]) [phone] [facility] [place]) [phone] [facility] [place]) - [phone] [facility]) - [phone] [facility], [address] [name], [place]

## 2021-07-11 NOTE — Progress Notes (Signed)
Subjective:   By signing my name below, I, Paula Gross, attest that this documentation has been prepared under the direction and in the presence of Paula Alar NP. 07/11/2021     Patient ID: Paula Gross, female    DOB: 04-Oct-2001, 20 y.o.   MRN: [id number]  Chief Complaint  Patient presents with   Annual Exam    Pt states not fasting     HPI Patient is in today for a comprehensive physical exam.  She denies having any unexpected weight change, hearing loss and rhinorrhea, visual disturbance, cough, chest pain and leg swelling, vomiting, diarrhea and blood in stool, or dysuria and frequency, for myalgias and arthralgias, rash, headaches, adenopathy, depression or anxiety at this time. She has no recent changes to her family medical history. She does not know any medical history for her fathers parents. She does not drink alcohol. She does not use drugs. She occasionally smokes cigarettes 2x weekly. She does smokes vapes daily. She is currently sexually active with a female partner.   Abdominal pain- She complains of occasional stomach pain with nausea. She denies having any pain under her ribs. She still has her gallbladder at this time.  Anxiety/Depression- She stopped working with a psychiatrist at this time. She reports that her mood is up and down recently. She gets angrier more often and has to actively try to calm herself down. She does not take any medication to manage her symptoms at this time.  Immunizations: She has 3 pfizer Covid-19 vaccines at this time. She did not bring her vaccine card on her during this visit. She is UTD on tetanus vaccines. She is UTD on HPV vaccines. She is not interested in HIV or hepatitis screening at this time. Diet: She reduces her eating portions. She eats at least twice a day. Her breakfast is typically leftovers from the previous day. She occasionally eats take-out food. She does eat snack in between meals. She mainly drinks water and  occasionally drinks soda.  Exercise: She started exercising the first 2 weeks of June but has stopped since then. She is struggling to get back into exercise. She plans to start walking in the morning with her mother.  Pap Smear- She has not yet completed a pap smear. She has an upcoming appointment with her GYN and plans to get the procedure there. Vision: She is UTD on vision care. She recently had a procedure done on her left eye in March, 2022.   Health Maintenance Due  Topic Date Due   Pneumococcal Vaccine 6-94 Years old (1 - PCV) Never done   CHLAMYDIA SCREENING  06/30/2020   PAP-Cervical Cytology Screening  Never done   PAP SMEAR-Modifier  Never done   COVID-19 Vaccine (3 - Booster for Pfizer series) 09/19/2020    Past Medical History:  Diagnosis Date   Anxiety    Depression     Past Surgical History:  Procedure Laterality Date   right eye sugery Right 02/2021    Family History  Problem Relation Age of Onset   Arthritis Mother    Diabetes Mother    Hyperlipidemia Mother    Hypertension Mother    Depression Mother    Arthritis Maternal Grandmother    Cancer Maternal Grandmother        colon   Hyperlipidemia Maternal Grandmother     Social History   Socioeconomic History   Marital status: Single    Spouse name: Not on file   Number of children:  Not on file   Years of education: Not on file   Highest education level: Not on file  Occupational History   Not on file  Tobacco Use   Smoking status: Some Days    Pack years: 0.00    Types: Cigarettes   Smokeless tobacco: Former    Quit date: 2021  Vaping Use   Vaping Use: Every day  Substance and Sexual Activity   Alcohol use: No    Alcohol/week: 0.0 standard drinks   Drug use: No   Sexual activity: Yes    Partners: Male  Other Topics Concern   Not on file  Social History Narrative   Lives with Mom   Dad ( has "rocky" relationship with her dad who lives locally)   1 half brother (does not liver with  her)   Goes to [employer]- Junior   Wants to go to college   Dog- mutt   Enjoys reading, television, drawing   Social Determinants of Radio broadcast assistant Strain: Not on file  Food Insecurity: Not on file  Transportation Needs: Not on file  Physical Activity: Not on file  Stress: Not on file  Social Connections: Not on file  Intimate Partner Violence: Not on file    Outpatient Medications Prior to Visit  Medication Sig Dispense Refill   AUROVELA FE 1.5/30 1.5-30 MG-MCG tablet TAKE 1 TABLET BY MOUTH DAILY 84 tablet 1   No facility-administered medications prior to visit.    No Known Allergies  Review of Systems  Constitutional:        (-)unexpected weight change (-)Adenopathy  HENT:  Negative for hearing loss.        (-)Rhinorrhea   Eyes:        (-)Visual disturbance  Respiratory:  Negative for cough.   Cardiovascular:  Negative for chest pain and leg swelling.  Gastrointestinal:  Positive for abdominal pain (Occasional) and nausea (with abdominal pain). Negative for blood in stool, constipation, diarrhea and vomiting.  Genitourinary:  Negative for dysuria and frequency.  Musculoskeletal:  Negative for joint pain and myalgias.  Skin:  Negative for rash.  Neurological:  Negative for headaches.  Psychiatric/Behavioral:  Negative for depression. The patient is not nervous/anxious.       Objective:    Physical Exam Constitutional:      General: She is not in acute distress.    Appearance: Normal appearance. She is not ill-appearing.  HENT:     Head: Normocephalic and atraumatic.     Right Ear: Tympanic membrane, ear canal and external ear normal.     Left Ear: Tympanic membrane, ear canal and external ear normal.  Eyes:     Extraocular Movements: Extraocular movements intact.     Pupils: Pupils are equal, round, and reactive to light.     Comments: No nystagmus  Cardiovascular:     Rate and Rhythm: Normal rate and regular rhythm.     Pulses:  Normal pulses.     Heart sounds: Normal heart sounds. No murmur heard.   No gallop.  Pulmonary:     Effort: Pulmonary effort is normal. No respiratory distress.     Breath sounds: Normal breath sounds. No wheezing, rhonchi or rales.  Abdominal:     General: Bowel sounds are normal. There is no distension.     Palpations: Abdomen is soft. There is no mass.     Tenderness: There is no abdominal tenderness. There is no guarding or rebound.  Hernia: No hernia is present.  Musculoskeletal:     Comments: 5/5 strength in both upper and lower extremities  Lymphadenopathy:     Cervical: No cervical adenopathy.  Skin:    General: Skin is warm and dry.  Neurological:     Mental Status: She is alert and oriented to person, place, and time.     Deep Tendon Reflexes:     Reflex Scores:      Patellar reflexes are 1+ on the right side and 1+ on the left side. Psychiatric:        Behavior: Behavior normal.    BP 116/80 (BP Location: Left Arm, Patient Position: Sitting, Cuff Size: Normal)   Pulse 89   Temp 98.8 F (37.1 C) (Oral)   Resp 18   Ht _0  (1.651 m)   Wt 261 lb 3.2 oz (118.5 kg)   SpO2 98%   BMI 43.47 kg/m  Wt Readings from Last 3 Encounters:  07/11/21 261 lb 3.2 oz (118.5 kg)  07/06/20 257 lb (116.6 kg)  08/14/19 252 lb 9.6 oz (114.6 kg)       Assessment & Plan:   Problem List Items Addressed This Visit       Unprioritized   Preventative health care - Primary    Wt Readings from Last 3 Encounters:  07/11/21 261 lb 3.2 oz (118.5 kg)  07/06/20 257 lb (116.6 kg)  08/14/19 252 lb 9.6 oz (114.6 kg)  Discussed healthy diet, exercise and weight loss. Pap will be done with GYN.         Nausea    Suspect GERD related symptoms.  Trial of omeprazole 42m once daily. Check H pylori Breath Test.        Relevant Orders   H. pylori breath test   Comp Met (CMET)   Major depressive disorder, recurrent severe without psychotic features ([place]    Uncontrolled.  Given her  hx, I recommended that she get established with psychiatry and I gave her some numbers to call to get an appointment scheduled.          Meds ordered this encounter  Medications   norethindrone-ethinyl estradiol-iron (AUROVELA FE 1.5/30) 1.5-30 MG-MCG tablet    Sig: Take 1 tablet by mouth daily.    Dispense:  84 tablet    Refill:  2   omeprazole (PRILOSEC) 40 MG capsule    Sig: Take 1 capsule (40 mg total) by mouth daily.    Dispense:  30 capsule    Refill:  3    Order Specific Question:   Supervising Provider    Answer:   BPenni HomansA [[id number]]    I, MDebbrah AlarNP, personally preformed the services described in this documentation.  All medical record entries made by the scribe were at my direction and in my presence.  I have reviewed the chart and discharge instructions (if applicable) and agree that the record reflects my personal performance and is accurate and complete. 07/11/2021   I,Paula Gross,acting as a sEducation administratorfor MNance Pear NP.,have documented all relevant documentation on the behalf of MNance Pear NP,as directed by  MNance Pear NP while in the presence of MNance Pear NP.   MNance Pear NP

## 2021-07-12 DIAGNOSIS — R110 Nausea: Secondary | ICD-10-CM | POA: Insufficient documentation

## 2021-07-12 NOTE — Assessment & Plan Note (Signed)
Suspect GERD related symptoms.  Trial of omeprazole 40mg  once daily. Check H pylori Breath Test.

## 2021-07-12 NOTE — Assessment & Plan Note (Signed)
Uncontrolled.  Given her hx, I recommended that she get established with psychiatry and I gave her some numbers to call to get an appointment scheduled.

## 2021-07-17 LAB — HM PAP SMEAR: HM Pap smear: NEGATIVE

## 2021-08-09 ENCOUNTER — Other Ambulatory Visit: Payer: Self-pay

## 2021-08-09 ENCOUNTER — Ambulatory Visit (INDEPENDENT_AMBULATORY_CARE_PROVIDER_SITE_OTHER): Payer: Managed Care, Other (non HMO) | Admitting: Family

## 2021-08-09 VITALS — BP 127/76 | HR 99 | Temp 98.5°F | Resp 16 | Ht 60.0 in | Wt 250.0 lb

## 2021-08-09 DIAGNOSIS — R059 Cough, unspecified: Secondary | ICD-10-CM | POA: Diagnosis not present

## 2021-08-09 DIAGNOSIS — R110 Nausea: Secondary | ICD-10-CM | POA: Diagnosis not present

## 2021-08-09 DIAGNOSIS — F332 Major depressive disorder, recurrent severe without psychotic features: Secondary | ICD-10-CM | POA: Diagnosis not present

## 2021-08-09 LAB — COMPREHENSIVE METABOLIC PANEL
ALT: 16 U/L (ref 0–35)
AST: 18 U/L (ref 0–37)
Albumin: 4.1 g/dL (ref 3.5–5.2)
Alkaline Phosphatase: 37 U/L — ABNORMAL LOW (ref 39–117)
BUN: 12 mg/dL (ref 6–23)
CO2: 22 mEq/L (ref 19–32)
Calcium: 9.8 mg/dL (ref 8.4–10.5)
Chloride: 104 mEq/L (ref 96–112)
Creatinine, Ser: 0.93 mg/dL (ref 0.40–1.20)
GFR: 87.51 mL/min (ref >60.00)
Glucose, Bld: 79 mg/dL (ref 70–99)
Potassium: 3.9 mEq/L (ref 3.5–5.1)
Sodium: 138 mEq/L (ref 135–145)
Total Bilirubin: 0.3 mg/dL (ref 0.2–1.2)
Total Protein: 7.6 g/dL (ref 6.0–8.3)

## 2021-08-09 NOTE — Assessment & Plan Note (Signed)
Seems improved. She is pleased that she is getting out of the house more.  I still think it would be good for her to establish with psychiatry and a therapist and she plans to look into this.

## 2021-08-09 NOTE — Assessment & Plan Note (Signed)
?   Smoker's cough, ? gerd related.  Encouraged pt to work on quitting smoking, continue omeprazole 40mg  once daily. Call if cough worsens or if it does not improve.

## 2021-08-09 NOTE — Patient Instructions (Signed)
Please call to schedule an appointment with psychiatry and a counselor.  Please continue omeprazole.  Complete lab work prior to leaving.

## 2021-08-09 NOTE — Progress Notes (Signed)
Subjective:   By signing my name below, I, Zite Okoli, attest that this documentation has been prepared under the direction and in the presence of Debbrah Alar, NP 08/09/2021   Patient ID: Paula Gross, female    DOB: 12/10/2001, 20 y.o.   MRN: [id number]  No chief complaint on file.   HPI Patient is in today for an office visit.  Cough- She mentions having a dry, persistent cough for the past 2-3 months. She denies wheezing and mentions the cough is productive sometimes. She is actively trying to reduce smoking.  Abdominal Pain- At her last visit, she complained of stomach pain and nausea and was prescribed 40 mg omeprazole PO daily. She reports the pain has reduced and become infrequent. She also mentions that she experiences nausea often after she eats and at occasional times.   Anxiety/Depression- She mentions her mood has been alright and her anger episodes have improved because she started going out more. She is still hesitant on going to see a psychiatrist.   Past Medical History:  Diagnosis Date   Anxiety    Depression     Past Surgical History:  Procedure Laterality Date   right eye sugery Right 02/2021    Family History  Problem Relation Age of Onset   Arthritis Mother    Diabetes Mother    Hyperlipidemia Mother    Hypertension Mother    Depression Mother    Arthritis Maternal Grandmother    Cancer Maternal Grandmother        colon   Hyperlipidemia Maternal Grandmother     Social History   Socioeconomic History   Marital status: Single    Spouse name: Not on file   Number of children: Not on file   Years of education: Not on file   Highest education level: Not on file  Occupational History   Not on file  Tobacco Use   Smoking status: Some Days    Types: Cigarettes   Smokeless tobacco: Former    Quit date: 2021  Vaping Use   Vaping Use: Every day  Substance and Sexual Activity   Alcohol use: No    Alcohol/week: 0.0 standard drinks    Drug use: No   Sexual activity: Yes    Partners: Male  Other Topics Concern   Not on file  Social History Narrative   Lives with Mom   Dad ( has "rocky" relationship with her dad who lives locally)   35 half brother (does not liver with her)   Goes to [employer]- [employer]   Wants to go to college   Dog- mutt   Enjoys reading, television, drawing   Social Determinants of Radio broadcast assistant Strain: Not on file  Food Insecurity: Not on file  Transportation Needs: Not on file  Physical Activity: Not on file  Stress: Not on file  Social Connections: Not on file  Intimate Partner Violence: Not on file    Outpatient Medications Prior to Visit  Medication Sig Dispense Refill   norethindrone-ethinyl estradiol-iron (AUROVELA FE 1.5/30) 1.5-30 MG-MCG tablet Take 1 tablet by mouth daily. 84 tablet 2   omeprazole (PRILOSEC) 40 MG capsule Take 1 capsule (40 mg total) by mouth daily. 30 capsule 3   No facility-administered medications prior to visit.    No Known Allergies  Review of Systems  Respiratory:  Positive for cough.   Gastrointestinal:  Positive for abdominal pain and nausea.      Objective:  Physical Exam Constitutional:      General: She is not in acute distress.    Appearance: Normal appearance. She is not ill-appearing.  HENT:     Head: Normocephalic and atraumatic.     Right Ear: External ear normal.     Left Ear: External ear normal.  Eyes:     Extraocular Movements: Extraocular movements intact.     Pupils: Pupils are equal, round, and reactive to light.  Cardiovascular:     Rate and Rhythm: Normal rate and regular rhythm.     Pulses: Normal pulses.     Heart sounds: Normal heart sounds. No murmur heard. Pulmonary:     Effort: Pulmonary effort is normal. No respiratory distress.     Breath sounds: Normal breath sounds. No wheezing or rhonchi.  Abdominal:     General: Bowel sounds are normal. There is no distension.     Palpations:  Abdomen is soft.     Tenderness: There is no abdominal tenderness. There is no guarding or rebound.  Musculoskeletal:     Cervical back: Neck supple.  Lymphadenopathy:     Cervical: No cervical adenopathy.  Skin:    General: Skin is warm and dry.  Neurological:     Mental Status: She is alert and oriented to person, place, and time.  Psychiatric:        Behavior: Behavior normal.        Judgment: Judgment normal.    BP 127/76 (BP Location: Right Arm, Patient Position: Sitting, Cuff Size: Large)   Pulse 99   Temp 98.5 F (36.9 C) (Oral)   Resp 16   Ht 5' (1.524 m)   Wt 250 lb (113.4 kg)   SpO2 100%   BMI 48.82 kg/m  Wt Readings from Last 3 Encounters:  08/09/21 250 lb (113.4 kg)  07/11/21 261 lb 3.2 oz (118.5 kg)  07/06/20 257 lb (116.6 kg)    Diabetic Foot Exam - Simple   No data filed    Lab Results  Component Value Date   WBC 6.9 08/11/2017   HGB 12.1 08/11/2017   HCT 35.5 (L) 08/11/2017   PLT 317 08/11/2017   GLUCOSE 79 08/09/2021   CHOL 162 08/12/2017   TRIG 125 08/12/2017   HDL 29 (L) 08/12/2017   LDLCALC 108 (H) 08/12/2017   ALT 16 08/09/2021   AST 18 08/09/2021   NA 138 08/09/2021   K 3.9 08/09/2021   CL 104 08/09/2021   CREATININE 0.93 08/09/2021   BUN 12 08/09/2021   CO2 22 08/09/2021   TSH 5.347 (H) 08/12/2017   HGBA1C 6.0 (H) 08/12/2017    Lab Results  Component Value Date   TSH 5.347 (H) 08/12/2017   Lab Results  Component Value Date   WBC 6.9 08/11/2017   HGB 12.1 08/11/2017   HCT 35.5 (L) 08/11/2017   MCV 89.6 08/11/2017   PLT 317 08/11/2017   Lab Results  Component Value Date   NA 138 08/09/2021   K 3.9 08/09/2021   CO2 22 08/09/2021   GLUCOSE 79 08/09/2021   BUN 12 08/09/2021   CREATININE 0.93 08/09/2021   BILITOT 0.3 08/09/2021   ALKPHOS 37 (L) 08/09/2021   AST 18 08/09/2021   ALT 16 08/09/2021   PROT 7.6 08/09/2021   ALBUMIN 4.1 08/09/2021   CALCIUM 9.8 08/09/2021   ANIONGAP 8 08/11/2017   GFR 87.51 08/09/2021    Lab Results  Component Value Date   CHOL 162 08/12/2017   Lab Results  Component Value Date   HDL 29 (L) 08/12/2017   Lab Results  Component Value Date   LDLCALC 108 (H) 08/12/2017   Lab Results  Component Value Date   TRIG 125 08/12/2017   Lab Results  Component Value Date   CHOLHDL 5.6 08/12/2017   Lab Results  Component Value Date   HGBA1C 6.0 (H) 08/12/2017       Assessment & Plan:   Problem List Items Addressed This Visit       Unprioritized   Nausea - Primary    Some improvement on PPI, but still having some persistent nausea. Will obtain cmet and abdominal US to further evaluate her gallbladder.        Relevant Orders   US Abdomen Complete   Comp Met (CMET) (Completed)   Major depressive disorder, recurrent severe without psychotic features ([place])    Seems improved. She is pleased that she is getting out of the house more.  I still think it would be good for her to establish with psychiatry and a therapist and she plans to look into this.        Cough    ? Smoker's cough, ? gerd related.  Encouraged pt to work on quitting smoking, continue omeprazole 67m once daily. Call if cough worsens or if it does not improve.        No orders of the defined types were placed in this encounter.   I,Zite Okoli,acting as a sEducation administratorfor [employer] NP.,have documented all relevant documentation on the behalf of MNance Pear NP,as directed by  MNance Pear NP while in the presence of MNance Pear NP.   I, MDebbrah Alar NP , personally preformed the services described in this documentation.  All medical record entries made by the scribe were at my direction and in my presence.  I have reviewed the chart and discharge instructions (if applicable) and agree that the record reflects my personal performance and is accurate and complete. 08/09/2021

## 2021-08-09 NOTE — Assessment & Plan Note (Signed)
Some improvement on PPI, but still having some persistent nausea. Will obtain cmet and abdominal US to further evaluate her gallbladder.

## 2021-08-16 ENCOUNTER — Other Ambulatory Visit: Payer: Self-pay

## 2021-08-16 ENCOUNTER — Ambulatory Visit (HOSPITAL_BASED_OUTPATIENT_CLINIC_OR_DEPARTMENT_OTHER)
Admission: RE | Admit: 2021-08-16 | Discharge: 2021-08-16 | Disposition: A | Discharge status: Home / Self Care | Payer: Managed Care, Other (non HMO) | Source: Ambulatory Visit | Attending: Family | Admitting: Family

## 2021-08-16 DIAGNOSIS — K769 Liver disease, unspecified: Secondary | ICD-10-CM | POA: Diagnosis not present

## 2021-08-16 DIAGNOSIS — R110 Nausea: Secondary | ICD-10-CM | POA: Diagnosis not present

## 2021-10-27 ENCOUNTER — Ambulatory Visit (INDEPENDENT_AMBULATORY_CARE_PROVIDER_SITE_OTHER): Payer: Managed Care, Other (non HMO) | Admitting: Family

## 2021-10-27 ENCOUNTER — Other Ambulatory Visit: Payer: Self-pay

## 2021-10-27 VITALS — BP 146/90 | HR 80 | Temp 98.4°F | Resp 16 | Wt 255.0 lb

## 2021-10-27 DIAGNOSIS — J039 Acute tonsillitis, unspecified: Secondary | ICD-10-CM | POA: Insufficient documentation

## 2021-10-27 DIAGNOSIS — K219 Gastro-esophageal reflux disease without esophagitis: Secondary | ICD-10-CM

## 2021-10-27 DIAGNOSIS — R030 Elevated blood-pressure reading, without diagnosis of hypertension: Secondary | ICD-10-CM | POA: Diagnosis not present

## 2021-10-27 DIAGNOSIS — F332 Major depressive disorder, recurrent severe without psychotic features: Secondary | ICD-10-CM | POA: Diagnosis not present

## 2021-10-27 DIAGNOSIS — I10 Essential (primary) hypertension: Secondary | ICD-10-CM | POA: Insufficient documentation

## 2021-10-27 NOTE — Assessment & Plan Note (Signed)
Symptoms are improving with augmentin. I advised pt to let us know if her symptoms worsen or if they do not continue to improve.

## 2021-10-27 NOTE — Assessment & Plan Note (Addendum)
BP Readings from Last 3 Encounters:  10/27/21 (!) 146/90  08/09/21 127/76  07/11/21 116/80   Discussed low sodium diet, exercise, weight loss.  Plan to recheck in 1 month.

## 2021-10-27 NOTE — Assessment & Plan Note (Signed)
Stable on omeprazole 40mg once daily. 

## 2021-10-27 NOTE — Assessment & Plan Note (Signed)
Uncontrolled, as well as anxiety.

## 2021-10-27 NOTE — Progress Notes (Signed)
Subjective:   By signing my name below, I, Paula Gross, attest that this documentation has been prepared under the direction and in the presence of Sandford Craze, NP, 10/27/2021   Patient ID: Paula Gross, female    DOB: 02-28-2001, 20 y.o.   MRN: [id number]  Chief Complaint  Patient presents with   Sore Throat    [facility] and is taking    Depression    Has not seen psychiatrist     HPI Patient is in today for an office visit.  Anxiety: She reports a way higher level on anxiety. She contributes this to growing up, Covid-19, and things happening in the world. She was previously working with a psychiatrist that prescribed her medication for her anxiety. She is no longer seeing her psychiatrist and is planning to go back to get back on medication again.  Depression: She notes a steady level of depression.  Tonsillitis: She notes that last week on Friday she began having a sore throat, cough, congestion, headache, body aches, and difficulty sleeping. She went to a health clinic and they diagnosed her with tonsillitis and gave her antibiotics. She tested negative for Covid-19 and strep throat. She complains that it hurts to lay on the right side of her head for extended periods of time. She notes that she has had a decreased appetite and nausea as well with the use of the antibiotics and the onset of her symptoms.  Reflux: She is compliant in taking 40 mg Prilosec to help with her acid reflux symptoms. She reports that she only have 1-2 bad days a week but this is an improvement from the onset of her symptoms.  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    Health Maintenance Due  Topic Date Due   Pneumococcal Vaccine 65-4 Years old (1 - PCV) Never done   COVID-19 Vaccine (3 - Booster for [employer] series) 06/14/2020   CHLAMYDIA SCREENING  06/30/2020   PAP-Cervical Cytology Screening  Never done   PAP SMEAR-Modifier  Never done    INFLUENZA VACCINE  Never done    Past Medical History:  Diagnosis Date   Anxiety    Depression     Past Surgical History:  Procedure Laterality Date   right eye sugery Right 02/2021    Family History  Problem Relation Age of Onset   Arthritis Mother    Diabetes Mother    Hyperlipidemia Mother    Hypertension Mother    Depression Mother    Arthritis Maternal Grandmother    Cancer Maternal Grandmother        colon   Hyperlipidemia Maternal Grandmother     Social History   Socioeconomic History   Marital status: Single    Spouse name: Not on file   Number of children: Not on file   Years of education: Not on file   Highest education level: Not on file  Occupational History   Not on file  Tobacco Use   Smoking status: Some Days    Types: Cigarettes   Smokeless tobacco: Former    Quit date: 2021  Vaping Use   Vaping Use: Every day  Substance and Sexual Activity   Alcohol use: No    Alcohol/week: 0.0 standard drinks   Drug use: No   Sexual activity: Yes    Partners: Male  Other Topics Concern   Not on file  Social History Narrative   Lives with Mom  Dad ( has "rocky" relationship with her dad who lives locally)   1 half brother (does not liver with her)   Goes to [employer]- [employer]   Wants to go to college   Dog- mutt   Enjoys reading, television, drawing   Social Determinants of Corporate investment banker Strain: Not on [employer] Insecurity: Not on file  Transportation Needs: Not on file  Physical Activity: Not on file  Stress: Not on file  Social Connections: Not on file  Intimate Partner Violence: Not on file    Outpatient Medications Prior to Visit  Medication Sig Dispense Refill   amoxicillin-clavulanate (AUGMENTIN) 875-125 MG tablet Take 1 tablet by mouth 2 (two) times daily.     norethindrone-ethinyl estradiol-iron (AUROVELA FE 1.5/30) 1.5-30 MG-MCG tablet Take 1 tablet by mouth daily. 84 tablet 2   omeprazole (PRILOSEC) 40 MG  capsule Take 1 capsule (40 mg total) by mouth daily. 30 capsule 3   No facility-administered medications prior to visit.    No Known Allergies  Review of Systems  Constitutional:        (+) decreased appetite (+) difficulty sleeping  HENT:  Positive for congestion and sore throat.   Respiratory:  Positive for cough.   Gastrointestinal:  Positive for nausea.      Objective:    Physical Exam Constitutional:      General: She is not in acute distress.    Appearance: Normal appearance. She is not ill-appearing.  HENT:     Head: Normocephalic and atraumatic.     Right Ear: External ear normal.     Left Ear: External ear normal.  Eyes:     Extraocular Movements: Extraocular movements intact.     Pupils: Pupils are equal, round, and reactive to light.  Cardiovascular:     Rate and Rhythm: Normal rate and regular rhythm.     Heart sounds: Normal heart sounds. No murmur heard.   No gallop.  Pulmonary:     Effort: Pulmonary effort is normal. No respiratory distress.     Breath sounds: Normal breath sounds. No wheezing or rales.  Lymphadenopathy:     Cervical: No cervical adenopathy.  Skin:    General: Skin is warm and dry.  Neurological:     Mental Status: She is alert and oriented to person, place, and time.  Psychiatric:        Behavior: Behavior normal.        Judgment: Judgment normal.    BP (!) 146/90 (BP Location: Right Arm, Patient Position: Sitting, Cuff Size: Large)   Pulse 80   Temp 98.4 F (36.9 C) (Oral)   Resp 16   Wt 255 lb (115.7 kg)   SpO2 100%   BMI 49.80 kg/m  Wt Readings from Last 3 Encounters:  10/27/21 255 lb (115.7 kg)  08/09/21 250 lb (113.4 kg)  07/11/21 261 lb 3.2 oz (118.5 kg)       Assessment & Plan:   Problem List Items Addressed This Visit       Unprioritized   Tonsillitis    Symptoms are improving with augmentin. I advised pt to let us know if her symptoms worsen or if they do not continue to improve.       Major depressive  disorder, recurrent severe without psychotic features ([facility])    Uncontrolled, as well as anxiety.       GERD (gastroesophageal reflux disease)    Stable on omeprazole 40mg  once daily.  Elevated blood pressure reading - Primary    BP Readings from Last 3 Encounters:  10/27/21 (!) 146/90  08/09/21 127/76  07/11/21 116/80  Discussed low sodium diet, exercise, weight loss.  Plan to recheck in 1 month.       No orders of the defined types were placed in this encounter.   I, Sandford Craze, NP, personally preformed the services described in this documentation.  All medical record entries made by the scribe were at my direction and in my presence.  I have reviewed the chart and discharge instructions (if applicable) and agree that the record reflects my personal performance and is accurate and complete. 10/27/2021  I,Paula Gross,acting as a Neurosurgeon for Lemont Fillers, NP.,have documented all relevant documentation on the behalf of Lemont Fillers, NP,as directed by  Lemont Fillers, NP while in the presence of Lemont Fillers, NP.  Lemont Fillers, NP

## 2021-11-10 ENCOUNTER — Ambulatory Visit: Payer: Managed Care, Other (non HMO) | Admitting: Family

## 2021-11-18 ENCOUNTER — Other Ambulatory Visit: Payer: Self-pay | Admitting: Family

## 2021-11-27 ENCOUNTER — Telehealth: Payer: Self-pay | Admitting: *Deleted

## 2021-11-27 NOTE — Telephone Encounter (Signed)
Patient was rescheduled to 01-08-2022

## 2021-11-27 NOTE — Telephone Encounter (Signed)
Patient Name Paula Gross Patient DOB [birth year] Call Type Message Only Information Provided Reason for Call Request to Reschedule Office Appointment Initial Comment The caller states that she is calling to reschedule an appt. Patient request to speak to RN No Additional Comment The caller declined triage. Disp. Time Disposition Final User 11/26/2021 3:29:56 PM General Information Provided Yes [name]

## 2021-11-29 ENCOUNTER — Ambulatory Visit: Payer: Managed Care, Other (non HMO) | Admitting: Family

## 2022-01-08 ENCOUNTER — Ambulatory Visit: Payer: Managed Care, Other (non HMO) | Admitting: Family

## 2022-02-02 ENCOUNTER — Ambulatory Visit (INDEPENDENT_AMBULATORY_CARE_PROVIDER_SITE_OTHER): Payer: Managed Care, Other (non HMO) | Admitting: Family

## 2022-02-02 DIAGNOSIS — F332 Major depressive disorder, recurrent severe without psychotic features: Secondary | ICD-10-CM

## 2022-02-02 DIAGNOSIS — K219 Gastro-esophageal reflux disease without esophagitis: Secondary | ICD-10-CM | POA: Diagnosis not present

## 2022-02-02 DIAGNOSIS — R030 Elevated blood-pressure reading, without diagnosis of hypertension: Secondary | ICD-10-CM | POA: Diagnosis not present

## 2022-02-02 NOTE — Patient Instructions (Signed)
Please complete lab work prior to leaving.   

## 2022-02-02 NOTE — Assessment & Plan Note (Signed)
Follow up bp is WNL. Encouraged pt to continue her work on diet/exercise/weight loss.

## 2022-02-02 NOTE — Assessment & Plan Note (Addendum)
Fair control. Encouraged her to keep her upcoming appointment with neurology. Gave her information to set up counseling.

## 2022-02-02 NOTE — Assessment & Plan Note (Signed)
Stable on PPI 

## 2022-02-02 NOTE — Progress Notes (Signed)
Subjective:     Patient ID: Paula Gross, female    DOB: 01-Jun-2001, 21 y.o.   MRN: [id number]  Chief Complaint  Patient presents with   Hypertension    Here for follow up BP    HPI Patient is in today for follow up.  HTN- last visit bp was elevated. Working on improving her diet.    BP Readings from Last 3 Encounters:  02/02/22 122/66  10/27/21 (!) 146/90  08/09/21 127/76   Wt Readings from Last 3 Encounters:  02/02/22 263 lb 6.4 oz (119.5 kg)  10/27/21 255 lb (115.7 kg)  08/09/21 250 lb (113.4 kg)   GERD- maintained on omeprazole. Reports symptoms stable.   Anxiety/Depression- Notes anxiety has been worse recently. Depression is some better.  She sees psychiatry- has appointment on 2/20.  [facility].    Health Maintenance Due  Topic Date Due   COVID-19 Vaccine (3 - Booster for Pfizer series) 06/14/2020   CHLAMYDIA SCREENING  06/30/2020   PAP-Cervical Cytology Screening  Never done   PAP SMEAR-Modifier  Never done   INFLUENZA VACCINE  Never done    Past Medical History:  Diagnosis Date   Anxiety    Depression     Past Surgical History:  Procedure Laterality Date   right eye sugery Right 02/2021    Family History  Problem Relation Age of Onset   Arthritis Mother    Diabetes Mother    Hyperlipidemia Mother    Hypertension Mother    Depression Mother    Arthritis Maternal Grandmother    Cancer Maternal Grandmother        colon   Hyperlipidemia Maternal Grandmother     Social History   Socioeconomic History   Marital status: Single    Spouse name: Not on file   Number of children: Not on file   Years of education: Not on file   Highest education level: Not on file  Occupational History   Not on file  Tobacco Use   Smoking status: Some Days    Types: Cigarettes   Smokeless tobacco: Former    Quit date: 2021  Vaping Use   Vaping Use: Every day  Substance and Sexual Activity   Alcohol use: No    Alcohol/week: 0.0 standard drinks    Drug use: No   Sexual activity: Yes    Partners: Male  Other Topics Concern   Not on file  Social History Narrative   Lives with Mom   Dad ( has "rocky" relationship with her dad who lives locally)   1 half brother (does not liver with her)   Goes to [employer]- [employer]   Wants to go to college   Dog- mutt   Enjoys reading, television, drawing   Social Determinants of Corporate investment banker Strain: Not on file  Food Insecurity: Not on file  Transportation Needs: Not on file  Physical Activity: Not on file  Stress: Not on file  Social Connections: Not on file  Intimate Partner Violence: Not on file    Outpatient Medications Prior to Visit  Medication Sig Dispense Refill   norethindrone-ethinyl estradiol-iron (AUROVELA FE 1.5/30) 1.5-30 MG-MCG tablet Take 1 tablet by mouth daily. 84 tablet 2   omeprazole (PRILOSEC) 40 MG capsule TAKE 1 CAPSULE(40 MG) BY MOUTH DAILY 30 capsule 3   amoxicillin-clavulanate (AUGMENTIN) 875-125 MG tablet Take 1 tablet by mouth 2 (two) times daily.     No facility-administered medications prior to visit.  No Known Allergies  ROS See HPI    Objective:    Physical Exam Constitutional:      General: She is not in acute distress.    Appearance: Normal appearance. She is well-developed.  HENT:     Head: Normocephalic and atraumatic.     Right Ear: External ear normal.     Left Ear: External ear normal.  Eyes:     General: No scleral icterus. Neck:     Thyroid: No thyromegaly.  Cardiovascular:     Rate and Rhythm: Normal rate and regular rhythm.     Heart sounds: Normal heart sounds. No murmur heard. Pulmonary:     Effort: Pulmonary effort is normal. No respiratory distress.     Breath sounds: Normal breath sounds. No wheezing.  Musculoskeletal:     Cervical back: Neck supple.  Skin:    General: Skin is warm and dry.  Neurological:     Mental Status: She is alert and oriented to person, place, and time.   Psychiatric:        Mood and Affect: Mood normal.        Behavior: Behavior normal.        Thought Content: Thought content normal.        Judgment: Judgment normal.    BP 122/66 (BP Location: Right Arm, Patient Position: Sitting, Cuff Size: Normal)    Pulse 91    Temp 98.3 F (36.8 C) (Oral)    Resp 16    Ht 5\' 5"  (1.651 m)    Wt 263 lb 6.4 oz (119.5 kg)    SpO2 100%    BMI 43.83 kg/m  Wt Readings from Last 3 Encounters:  02/02/22 263 lb 6.4 oz (119.5 kg)  10/27/21 255 lb (115.7 kg)  08/09/21 250 lb (113.4 kg)       Assessment & Plan:   Problem List Items Addressed This Visit       Unprioritized   Severe recurrent major depression without psychotic features ([facility])    Fair control. Encouraged her to keep her upcoming appointment with neurology. Gave her information to set up counseling.       GERD (gastroesophageal reflux disease)    Stable on PPI.        Elevated blood pressure reading    Follow up bp is WNL. Encouraged pt to continue her work on diet/exercise/weight loss.        I have discontinued Tramaine E. Ovens's amoxicillin-clavulanate. I am also having her maintain her norethindrone-ethinyl estradiol-iron and omeprazole.  No orders of the defined types were placed in this encounter.

## 2022-02-21 ENCOUNTER — Telehealth: Payer: Self-pay | Admitting: *Deleted

## 2022-02-21 ENCOUNTER — Other Ambulatory Visit: Payer: Self-pay | Admitting: *Deleted

## 2022-02-21 MED ORDER — OMEPRAZOLE 40 MG PO CPDR: DELAYED_RELEASE_CAPSULE | ORAL | 0 refills | Status: DC

## 2022-02-21 NOTE — Telephone Encounter (Signed)
Insurance will only cover a 90 day supply.

## 2022-02-21 NOTE — Telephone Encounter (Signed)
Prior auth started via cover my meds.  Awaiting determination.  Key: [id number]

## 2022-02-21 NOTE — Telephone Encounter (Signed)
Prior Paula Gross was not needed.  Will send letter to pharmacy.  Why was my request cancelled? This medication currently does not require clinical review, and you may pick this up from your pharmacy. This medication or product is on your plan's list of covered drugs. Prior authorization is not required at this time. If your pharmacy has questions regarding the processing of your prescription, please have them call the [facility] pharmacy help desk at ([phone].

## 2022-07-30 ENCOUNTER — Other Ambulatory Visit: Payer: Self-pay

## 2022-08-01 ENCOUNTER — Telehealth: Payer: Self-pay | Admitting: Family

## 2022-08-01 ENCOUNTER — Ambulatory Visit: Payer: Managed Care, Other (non HMO) | Admitting: Family

## 2022-08-01 DIAGNOSIS — I10 Essential (primary) hypertension: Secondary | ICD-10-CM | POA: Diagnosis not present

## 2022-08-01 DIAGNOSIS — K219 Gastro-esophageal reflux disease without esophagitis: Secondary | ICD-10-CM | POA: Diagnosis not present

## 2022-08-01 MED ORDER — AMLODIPINE BESYLATE 2.5 MG PO TABS: 2.5000 mg | ORAL_TABLET | Freq: Every day | ORAL | 0 refills | Status: DC

## 2022-08-01 MED ORDER — OMEPRAZOLE 40 MG PO CPDR: DELAYED_RELEASE_CAPSULE | ORAL | 0 refills | Status: DC

## 2022-08-01 NOTE — Assessment & Plan Note (Signed)
BP Readings from Last 3 Encounters:  08/01/22 (!) 143/86  02/02/22 122/66  10/27/21 (!) 146/90   Uncontrolled.  Add amlodipine 2.5mg  once daily.

## 2022-08-01 NOTE — Telephone Encounter (Signed)
Records release faxed 

## 2022-08-01 NOTE — Assessment & Plan Note (Signed)
Stable. She believes a 90 day supply would be cheaper at [facility] of omeprazole. Rx sent. If it is still too costly, recommended that she use a good rx coupon.

## 2022-08-01 NOTE — Progress Notes (Signed)
Subjective:   By signing my name below, I, Parks Ranger, attest that this documentation has been prepared under the direction and in the presence of Sandford Craze, NP 08/01/2022     Patient ID: Paula Gross, female    DOB: [birth year], 21 y.o.   MRN: [id number]  Chief Complaint  Patient presents with   Gastroesophageal Reflux    "Doing well on omeprazole"    HPI Patient is in today for follow up visit.  Omeprazole: She no longer takes the omeprazole because it had ran out, and she had difficulties with her insurance to cover the expense.  Blood Pressure: During the visit her blood pressure was 132/95 mmHg.   Pap Smear: She states that she had a pap smear last year at "[facility]."  Exercise: She has not been exercising regularly.  Diet: She tries to manage her diet by eating fruits while at work.  Medications: She continues to follow up with psychiatry. She remains complaint with Prilosec, Desvenlafaxine and Hydroxyzine.  Health Maintenance Due  Topic Date Due   COVID-19 Vaccine (3 - Pfizer series) 06/14/2020   CHLAMYDIA SCREENING  06/30/2020   PAP-Cervical Cytology Screening  Never done   PAP SMEAR-Modifier  Never done   INFLUENZA VACCINE  07/31/2022    Past Medical History:  Diagnosis Date   Anxiety    Depression     Past Surgical History:  Procedure Laterality Date   right eye sugery Right 02/2021    Family History  Problem Relation Age of Onset   Arthritis Mother    Diabetes Mother    Hyperlipidemia Mother    Hypertension Mother    Depression Mother    Arthritis Maternal Grandmother    Cancer Maternal Grandmother        colon   Hyperlipidemia Maternal Grandmother     Social History   Socioeconomic History   Marital status: Single    Spouse name: Not on file   Number of children: Not on file   Years of education: Not on file   Highest education level: Not on file  Occupational History   Not on file  Tobacco Use   Smoking  status: Some Days    Types: Cigarettes   Smokeless tobacco: Former    Quit date: 2021  Vaping Use   Vaping Use: Every day  Substance and Sexual Activity   Alcohol use: No    Alcohol/week: 0.0 standard drinks of alcohol   Drug use: No   Sexual activity: Yes    Partners: Male  Other Topics Concern   Not on file  Social History Narrative   Lives with Mom   Dad ( has "rocky" relationship with her dad who lives locally)   1 half brother (does not liver with her)   Goes to [employer]- [employer]   Wants to go to college   Dog- mutt   Enjoys reading, television, drawing   Social Determinants of Corporate investment banker Strain: Not on file  Food Insecurity: Not on file  Transportation Needs: Not on file  Physical Activity: Not on file  Stress: Not on file  Social Connections: Not on file  Intimate Partner Violence: Not on file    Outpatient Medications Prior to Visit  Medication Sig Dispense Refill   desvenlafaxine (PRISTIQ) 50 MG 24 hr tablet Take 50 mg by mouth at bedtime.     hydrOXYzine (ATARAX) 25 MG tablet Take 25-50 mg by mouth at bedtime as needed.  norethindrone-ethinyl estradiol-iron (AUROVELA FE 1.5/30) 1.5-30 MG-MCG tablet Take 1 tablet by mouth daily. 84 tablet 2   omeprazole (PRILOSEC) 40 MG capsule TAKE 1 CAPSULE(40 MG) BY MOUTH DAILY 90 capsule 0   No facility-administered medications prior to visit.    No Known Allergies  ROS See HPI    Objective:    Physical Exam Constitutional:      Appearance: Normal appearance. She is not ill-appearing.  HENT:     Head: Normocephalic and atraumatic.     Right Ear: External ear normal.     Left Ear: External ear normal.  Eyes:     Extraocular Movements: Extraocular movements intact.     Pupils: Pupils are equal, round, and reactive to light.  Cardiovascular:     Rate and Rhythm: Normal rate and regular rhythm.     Pulses: Normal pulses.     Heart sounds: Normal heart sounds. No murmur heard.     No gallop.  Pulmonary:     Effort: Pulmonary effort is normal. No respiratory distress.     Breath sounds: Normal breath sounds. No wheezing or rales.  Lymphadenopathy:     Cervical: No cervical adenopathy.  Skin:    General: Skin is warm and dry.  Neurological:     Mental Status: She is alert and oriented to person, place, and time.  Psychiatric:        Judgment: Judgment normal.     BP (!) 143/86 (BP Location: Right Arm, Patient Position: Sitting, Cuff Size: Large)   Pulse 79   Temp 99.5 F (37.5 C) (Oral)   Resp 16   Wt 262 lb (118.8 kg)   SpO2 100%   BMI 43.60 kg/m  Wt Readings from Last 3 Encounters:  08/01/22 262 lb (118.8 kg)  02/02/22 263 lb 6.4 oz (119.5 kg)  10/27/21 255 lb (115.7 kg)       Assessment & Plan:   Problem List Items Addressed This Visit       Unprioritized   Hypertension    BP Readings from Last 3 Encounters:  08/01/22 (!) 143/86  02/02/22 122/66  10/27/21 (!) 146/90  Uncontrolled.  Add amlodipine 2.5mg  once daily.       Relevant Medications   amLODipine (NORVASC) 2.5 MG tablet   GERD (gastroesophageal reflux disease)    She believes a 90 day supply would be cheaper at [facility] of omeprazole. Rx sent. If it is still too costly, recommended that she use a good rx coupon.       Relevant Medications   omeprazole (PRILOSEC) 40 MG capsule     Meds ordered this encounter  Medications   omeprazole (PRILOSEC) 40 MG capsule    Sig: TAKE 1 CAPSULE(40 MG) BY MOUTH DAILY    Dispense:  90 capsule    Refill:  0   amLODipine (NORVASC) 2.5 MG tablet    Sig: Take 1 tablet (2.5 mg total) by mouth daily.    Dispense:  90 tablet    Refill:  0    Order Specific Question:   Supervising Provider    Answer:   Danise Edge A [[id number]]    I, Lemont Fillers, NP, personally preformed the services described in this documentation.  All medical record entries made by the scribe were at my direction and in my presence.  I have reviewed the chart and  discharge instructions (if applicable) and agree that the record reflects my personal performance and is accurate and complete. 08/01/2022   I,Tinashe Williams,acting  as a Neurosurgeon for Lemont Fillers, NP.,have documented all relevant documentation on the behalf of Lemont Fillers, NP,as directed by  Lemont Fillers, NP while in the presence of Lemont Fillers, NP.    Lemont Fillers, NP

## 2022-08-01 NOTE — Patient Instructions (Signed)
Please start amlodipine for blood pressure.

## 2022-08-01 NOTE — Telephone Encounter (Signed)
[facility] call [facility] to request copy [facility] her pap smear.

## 2022-08-03 ENCOUNTER — Ambulatory Visit: Payer: Managed Care, Other (non HMO) | Admitting: Family

## 2022-09-04 ENCOUNTER — Ambulatory Visit: Payer: Managed Care, Other (non HMO) | Admitting: Family

## 2022-09-04 ENCOUNTER — Encounter: Payer: Self-pay | Admitting: Family

## 2022-09-04 VITALS — BP 135/74 | HR 89 | Temp 99.0°F | Resp 16 | Ht 65.7 in | Wt 267.0 lb

## 2022-09-04 DIAGNOSIS — Z23 Encounter for immunization: Secondary | ICD-10-CM

## 2022-09-04 DIAGNOSIS — I10 Essential (primary) hypertension: Secondary | ICD-10-CM

## 2022-09-04 DIAGNOSIS — Z0000 Encounter for general adult medical examination without abnormal findings: Secondary | ICD-10-CM

## 2022-09-04 DIAGNOSIS — R739 Hyperglycemia, unspecified: Secondary | ICD-10-CM | POA: Diagnosis not present

## 2022-09-04 LAB — COMPREHENSIVE METABOLIC PANEL
ALT: 17 U/L (ref 0–35)
AST: 17 U/L (ref 0–37)
Albumin: 3.7 g/dL (ref 3.5–5.2)
Alkaline Phosphatase: 40 U/L (ref 39–117)
BUN: 13 mg/dL (ref 6–23)
CO2: 26 mEq/L (ref 19–32)
Calcium: 9.1 mg/dL (ref 8.4–10.5)
Chloride: 104 mEq/L (ref 96–112)
Creatinine, Ser: 0.75 mg/dL (ref 0.40–1.20)
GFR: 112.44 mL/min (ref >60.00)
Glucose, Bld: 134 mg/dL — ABNORMAL HIGH (ref 70–99)
Potassium: 4.3 mEq/L (ref 3.5–5.1)
Sodium: 138 mEq/L (ref 135–145)
Total Bilirubin: 0.2 mg/dL (ref 0.2–1.2)
Total Protein: 7.4 g/dL (ref 6.0–8.3)

## 2022-09-04 LAB — HEMOGLOBIN A1C: Hgb A1c MFr Bld: 6.2 % (ref 4.6–6.5)

## 2022-09-04 LAB — LIPID PANEL
Cholesterol: 157 mg/dL (ref 0–200)
HDL: 40.8 mg/dL (ref >39.00)
LDL Cholesterol: 96 mg/dL (ref 0–99)
NonHDL: 116.65
Total CHOL/HDL Ratio: 4
Triglycerides: 103 mg/dL (ref 0.0–149.0)
VLDL: 20.6 mg/dL (ref 0.0–40.0)

## 2022-09-04 NOTE — Assessment & Plan Note (Addendum)
Wt Readings from Last 3 Encounters:  09/04/22 267 lb (121.1 kg)  08/01/22 262 lb (118.8 kg)  02/02/22 263 lb 6.4 oz (119.5 kg)   She reports that she is eating ground turkey/chicken.  Flu shot today. Recommended covid booster when it becomes available. Tetanus and pap up to date.

## 2022-09-04 NOTE — Progress Notes (Signed)
Subjective:   By signing my name below, I, [name], attest that this documentation has been prepared under the direction and in the presence of Paula Chimera, NP 09/04/2022   Patient ID: Paula Gross, female    DOB: 2001-11-30, 21 y.o.   MRN: [id number]  Chief Complaint  Patient presents with   Annual Exam         HPI Patient is in today for a comprehensive physical exam.   Anxiety: She states that some of her anxiety is caused by the possibility of becoming sick. Her roommate is immunocompromised so she wants to be conscious about her environment.  Blood Pressure: As of today's visit, her blood pressure is normal. She is currently taking 2.5 Mg of Amlodipine. When she takes the medication without eating, she experiences some nausea and dizziness.  BP Readings from Last 3 Encounters:  09/04/22 135/74  08/01/22 (!) 143/86  02/02/22 122/66   Pulse Readings from Last 3 Encounters:  09/04/22 89  08/01/22 79  02/02/22 91   She denies having any fever, new muscle pain, joint pain , new moles, rashes, congestion, sinus pain, sore throat, palpations, cough, SOB ,wheezing,n/v/d constipation, blood in stool, dysuria, frequency, hematuria, depression, anxiety, headaches at this time  Social history: She is currently vaping. She reports no recent surgeries. She denies of any changes to her family medical history.  Pap Smear: Last completed on 07/17/2021. She is following up with a gynecologist for her pap smears and birth control. She is scheduled to see her gynecologist on 09/27/2022.  Immunizations: She has received an influenza vaccine during today's visit.  Diet: She states that her diet is not improving but it's mostly due to costs. She is regularly eating greens and ground [place]. She also has cut out juices from her diet.  Exercise: She is improving her exercise. She does about 10-20 minutes of exercises that she gets from [employer].  Dental: She is UTD on dental  exams.  Vision: She is UTD on vision exams.   Health Maintenance Due  Topic Date Due   COVID-19 Vaccine (3 - Pfizer series) 06/14/2020   CHLAMYDIA SCREENING  06/30/2020    Past Medical History:  Diagnosis Date   Anxiety    Depression     Past Surgical History:  Procedure Laterality Date   right eye sugery Right 02/2021    Family History  Problem Relation Age of Onset   Arthritis Mother    Diabetes Mother    Hyperlipidemia Mother    Hypertension Mother    Depression Mother    Arthritis Maternal Grandmother    Cancer Maternal Grandmother        colon   Hyperlipidemia Maternal Grandmother     Social History   Socioeconomic History   Marital status: Single    Spouse name: Not on file   Number of children: Not on file   Years of education: Not on file   Highest education level: Not on file  Occupational History   Not on file  Tobacco Use   Smoking status: Some Days    Types: Cigarettes   Smokeless tobacco: Former    Quit date: 2021  Vaping Use   Vaping Use: Every day  Substance and Sexual Activity   Alcohol use: No    Alcohol/week: 0.0 standard drinks of alcohol   Drug use: No   Sexual activity: Yes    Partners: Male  Other Topics Concern   Not on file  Social History Narrative   Lives with Mom   Dad ( has "rocky" relationship with her dad who lives locally)   51 half brother (does not liver with her)   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 90 tablet 0   desvenlafaxine (PRISTIQ) 50 MG  24 hr tablet Take 50 mg by mouth at bedtime.     hydrOXYzine (ATARAX) 25 MG tablet Take 25-50 mg by mouth at bedtime as needed.     norethindrone-ethinyl estradiol-iron (AUROVELA FE 1.5/30) 1.5-30 MG-MCG tablet Take 1 tablet by mouth daily. 84 tablet 2   omeprazole (PRILOSEC) 40 MG capsule TAKE 1 CAPSULE(40 MG) BY MOUTH DAILY 90 capsule 0   No facility-administered medications prior to visit.    No Known Allergies  Review of Systems  Constitutional:  Negative for fever.  HENT:  Negative for congestion, hearing loss, sinus pain, sore throat and tinnitus.   Eyes: Negative.   Respiratory:  Negative for cough, shortness of breath and wheezing.   Cardiovascular:  Negative for palpitations.  Gastrointestinal:  Negative for blood in stool, constipation, diarrhea, nausea and vomiting.  Genitourinary:  Negative for dysuria, frequency and hematuria.  Musculoskeletal:  Negative for joint pain and myalgias.  Skin:  Negative for rash.       (-) [place]  Neurological:  Negative for headaches.  Psychiatric/Behavioral:  Negative for depression. The patient is not nervous/anxious.        Objective:    Physical Exam Constitutional:      General: She is not in acute distress.    Appearance: Normal appearance. She is not ill-appearing.  HENT:     Head: Normocephalic and atraumatic.     Right Ear: Tympanic membrane, ear canal and external ear normal.     Left Ear: Tympanic membrane, ear canal and external ear normal.  Eyes:     Extraocular Movements: Extraocular movements intact.     Pupils: Pupils are equal, round, and reactive to light.  Neck:     Thyroid: No thyromegaly.  Cardiovascular:     Rate and Rhythm: Normal rate and regular rhythm.     Heart sounds: Normal heart sounds. No murmur heard.    No gallop.  Pulmonary:     Effort: Pulmonary effort is normal. No respiratory distress.     Breath sounds: Normal breath sounds. No wheezing or rales.  Abdominal:     General: Bowel sounds  are normal. There is no distension.     Palpations: Abdomen is soft.     Tenderness: There is no abdominal tenderness. There is no guarding.  Musculoskeletal:     Comments: 5/5 strength in both upper and lower extremities    Lymphadenopathy:     Cervical: No cervical adenopathy.  Skin:    General: Skin is warm and dry.  Neurological:     Mental Status: She is alert and oriented to person, place, and time.     Deep Tendon Reflexes:     Reflex Scores:  Patellar reflexes are 2+ on the right side and 2+ on the left side. Psychiatric:        Mood and Affect: Mood normal.        Behavior: Behavior normal.        Judgment: Judgment normal.     BP 135/74 (BP Location: Right Arm, Patient Position: Sitting, Cuff Size: Large)   Pulse 89   Temp 99 F (37.2 C) (Oral)   Resp 16   Ht 5' 5.7" (1.669 m)   Wt 267 lb (121.1 kg)   SpO2 100%   BMI 43.49 kg/m  Wt Readings from Last 3 Encounters:  09/04/22 267 lb (121.1 kg)  08/01/22 262 lb (118.8 kg)  02/02/22 263 lb 6.4 oz (119.5 kg)       Assessment & Plan:   Problem List Items Addressed This Visit       Unprioritized   Preventative health care - Primary    Wt Readings from Last 3 Encounters:  09/04/22 267 lb (121.1 kg)  08/01/22 262 lb (118.8 kg)  02/02/22 263 lb 6.4 oz (119.5 kg)  She reports that she is eating ground turkey/chicken.  Flu shot today. Recommended covid booster when it becomes available. Tetanus and pap up to date.       Hypertension    BP Readings from Last 3 Encounters:  09/04/22 135/74  08/01/22 (!) 143/86  02/02/22 122/66  BP is not at goal. Continue amlodipine 2.52m once daily.       Other Visit Diagnoses     Needs flu shot       Relevant Orders   Flu Vaccine QUAD 6+ mos PF IM (Fluarix Quad PF) (Completed)   Hyperglycemia       Relevant Orders   Hemoglobin A1c   Comp Met (CMET)   Morbid obesity (HCC)       Relevant Orders   Lipid panel      No orders of the defined types were placed in  this encounter.   I, MNance Pear NP, personally preformed the services described in this documentation.  All medical record entries made by the scribe were at my direction and in my presence.  I have reviewed the chart and discharge instructions (if applicable) and agree that the record reflects my personal performance and is accurate and complete. 09/04/2022   I,Amber Collins,acting as a scribe for MNance Pear NP.,have documented all relevant documentation on the behalf of MNance Pear NP,as directed by  MNance Pear NP while in the presence of MNance Pear NP.    MNance Pear NP

## 2022-09-04 NOTE — Assessment & Plan Note (Signed)
BP Readings from Last 3 Encounters:  09/04/22 135/74  08/01/22 (!) 143/86  02/02/22 122/66   BP is not at goal. Continue amlodipine 2.5mg  once daily.

## 2022-10-27 ENCOUNTER — Other Ambulatory Visit: Payer: Self-pay | Admitting: Family

## 2022-11-03 ENCOUNTER — Other Ambulatory Visit: Payer: Self-pay | Admitting: Family

## 2022-11-05 MED ORDER — OMEPRAZOLE 40 MG PO CPDR: DELAYED_RELEASE_CAPSULE | ORAL | 0 refills | Status: DC

## 2022-11-18 ENCOUNTER — Other Ambulatory Visit: Payer: Self-pay | Admitting: Family

## 2022-12-04 ENCOUNTER — Ambulatory Visit: Payer: Managed Care, Other (non HMO) | Admitting: Family

## 2022-12-04 VITALS — BP 117/59 | HR 90 | Temp 98.2°F | Resp 16 | Wt 274.0 lb

## 2022-12-04 DIAGNOSIS — F332 Major depressive disorder, recurrent severe without psychotic features: Secondary | ICD-10-CM | POA: Diagnosis not present

## 2022-12-04 DIAGNOSIS — I10 Essential (primary) hypertension: Secondary | ICD-10-CM | POA: Diagnosis not present

## 2022-12-04 DIAGNOSIS — Z309 Encounter for contraceptive management, unspecified: Secondary | ICD-10-CM | POA: Diagnosis not present

## 2022-12-04 DIAGNOSIS — G4700 Insomnia, unspecified: Secondary | ICD-10-CM

## 2022-12-04 DIAGNOSIS — K219 Gastro-esophageal reflux disease without esophagitis: Secondary | ICD-10-CM

## 2022-12-04 MED ORDER — NORETHIN ACE-ETH ESTRAD-FE 1.5-30 MG-MCG PO TABS: 1.0000 | ORAL_TABLET | Freq: Every day | ORAL | 4 refills | Status: DC

## 2022-12-04 MED ORDER — OMEPRAZOLE 20 MG PO CPDR: 20.0000 mg | DELAYED_RELEASE_CAPSULE | Freq: Every day | ORAL | 1 refills | Status: DC

## 2022-12-04 NOTE — Assessment & Plan Note (Signed)
Continue aurovela

## 2022-12-04 NOTE — Progress Notes (Addendum)
Subjective:   By signing my name below, I, [name], attest that this documentation has been prepared under the direction and in the presence of [name], NP 12/04/2022     Patient ID: Paula Gross, female    DOB: 2001-03-15, 21 y.o.   MRN: [id number]  Chief Complaint  Patient presents with   Hypertension    Here for follow up    HPI Patient is in today for an office visit  Refill: She is requesting a refill of Aurovela Fe 1.5/30.   Psychiatry: She reports that she has not been in touch with her psychiatrist recently. She states that it has been about 6 months since she last saw them.   Blood Pressure: As of today's visit, her blood pressure is slightly elevated. She last took her 2.5 mg of Amlodipine during the night before. Her second blood pressure reading is 117/59 BP Readings from Last 3 Encounters:  12/04/22 (!) 117/59  09/04/22 135/74  08/01/22 (!) 143/86   Pulse Readings from Last 3 Encounters:  12/04/22 90  09/04/22 89  08/01/22 79   Heartburn: She is continuing to take 40 mg of Omeprazole daily. She has discontinued the medication in the past but noticed her symptoms slowly reappearing. 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   Sleep: She is currently taking 25-50 mg of Hydroxyzine. She states that it is hard for her to stay asleep. She takes the medication right before she is about to sleep. She does not drink caffeine as much as she used to but she does drink Sprite occasionally.  Pap Smear: Last completed on 07/17/2021  Health Maintenance Due  Topic Date Due   CHLAMYDIA SCREENING  06/30/2020   COVID-19 Vaccine (3 - 2023-24 season) 08/31/2022    Past Medical History:  Diagnosis Date   Anxiety    Depression     Past Surgical History:  Procedure Laterality Date   right eye sugery Right 02/2021    Family History  Problem Relation Age of Onset   Arthritis Mother    Diabetes Mother     Hyperlipidemia Mother    Hypertension Mother    Depression Mother    Arthritis Maternal Grandmother    Cancer Maternal Grandmother        colon   Hyperlipidemia Maternal Grandmother     Social History   Socioeconomic History   Marital status: Single    Spouse name: Not on file   Number of children: Not on file   Years of education: Not on file   Highest education level: Not on file  Occupational History   Not on file  Tobacco Use   Smoking status: Some Days    Types: Cigarettes   Smokeless tobacco: Former    Quit date: 2021  Vaping Use   Vaping Use: Every day  Substance and Sexual Activity   Alcohol use: No    Alcohol/week: 0.0 standard drinks of alcohol   Drug use: No   Sexual activity: Yes    Partners: Male  Other Topics Concern   Not on file  Social History Narrative   Lives with Mom   Dad ( has "rocky" relationship with her dad who lives locally)   58 half brother (does not liver with her)   Goes to [employer]- [employer]   Dog- mutt   Enjoys reading, television, drawing   Social Determinants of Engineer, drilling  Resource Strain: Not on file  Food Insecurity: Not on file  Transportation Needs: Not on file  Physical Activity: Not on file  Stress: Not on file  Social Connections: Not on file  Intimate Partner Violence: Not on file    Outpatient Medications Prior to Visit  Medication Sig Dispense Refill   amLODipine (NORVASC) 2.5 MG tablet TAKE 1 TABLET(2.5 MG) BY MOUTH DAILY 90 tablet 0   desvenlafaxine (PRISTIQ) 50 MG 24 hr tablet Take 50 mg by mouth at bedtime.     hydrOXYzine (ATARAX) 25 MG tablet Take 25-50 mg by mouth at bedtime as needed.     norethindrone-ethinyl estradiol-iron (AUROVELA FE 1.5/30) 1.5-30 MG-MCG tablet Take 1 tablet by mouth daily. 84 tablet 2   omeprazole (PRILOSEC) 40 MG capsule TAKE 1 CAPSULE(40 MG) BY MOUTH DAILY 90 capsule 0   No facility-administered medications prior to visit.    No Known Allergies  ROS      Objective:    Physical Exam Constitutional:      General: She is not in acute distress.    Appearance: Normal appearance. She is not ill-appearing.  HENT:     Head: Normocephalic and atraumatic.     Right Ear: External ear normal.     Left Ear: External ear normal.  Eyes:     Extraocular Movements: Extraocular movements intact.     Pupils: Pupils are equal, round, and reactive to light.  Cardiovascular:     Rate and Rhythm: Normal rate and regular rhythm.     Heart sounds: Normal heart sounds. No murmur heard.    No gallop.  Pulmonary:     Effort: Pulmonary effort is normal. No respiratory distress.     Breath sounds: Normal breath sounds. No wheezing or rales.  Skin:    General: Skin is warm and dry.  Neurological:     Mental Status: She is alert and oriented to person, place, and time.  Psychiatric:        Mood and Affect: Mood normal.        Behavior: Behavior normal.        Judgment: Judgment normal.     BP (!) 117/59 (BP Location: Right Arm, Patient Position: Sitting, Cuff Size: Large)   Pulse 90   Temp 98.2 F (36.8 C) (Oral)   Resp 16   Wt 274 lb (124.3 kg)   SpO2 100%   BMI 44.63 kg/m  Wt Readings from Last 3 Encounters:  12/04/22 274 lb (124.3 kg)  09/04/22 267 lb (121.1 kg)  08/01/22 262 lb (118.8 kg)       Assessment & Plan:   Problem List Items Addressed This Visit       Unprioritized   Major depressive disorder, recurrent severe without psychotic features ([place])    Uncontrolled. Recommended that she schedule follow up with her psychiatrist.       Insomnia    Uncontrolled. She does have caffeine in the afternoons sometimes. Discussed avoiding caffeine after noon. Discussed good sleep hygiene strategies. She continues hydroxyzine prn per psychiatry.       Hypertension - Primary    BP Readings from Last 3 Encounters:  12/04/22 (!) 117/59  09/04/22 135/74  08/01/22 (!) 143/86  Currently on amlodipine 2.5 mg once daily. Initial bp was  elevated. Repeat BP is improved. Continue current dose of amlodipine.       GERD (gastroesophageal reflux disease)    Continues omeprazole.  Will try cutting back from 40mg  to 20mg  to see  if she can tolerate.       Relevant Medications   omeprazole (PRILOSEC) 20 MG capsule   Encounter for contraceptive management    Continue aurovela       Meds ordered this encounter  Medications   norethindrone-ethinyl estradiol-iron (AUROVELA FE 1.5/30) 1.5-30 MG-MCG tablet    Sig: Take 1 tablet by mouth daily.    Dispense:  84 tablet    Refill:  4   omeprazole (PRILOSEC) 20 MG capsule    Sig: Take 1 capsule (20 mg total) by mouth daily.    Dispense:  90 capsule    Refill:  1    Order Specific Question:   Supervising Provider    Answer:   Danise Edge A [[id number]]    I, Lemont Fillers, NP, personally preformed the services described in this documentation.  All medical record entries made by the scribe were at my direction and in my presence.  I have reviewed the chart and discharge instructions (if applicable) and agree that the record reflects my personal performance and is accurate and complete. 12/04/2022   I,Amber Collins,acting as a scribe for Lemont Fillers, NP.,have documented all relevant documentation on the behalf of Lemont Fillers, NP,as directed by  Lemont Fillers, NP while in the presence of Lemont Fillers, NP.    Lemont Fillers, NP

## 2022-12-04 NOTE — Assessment & Plan Note (Addendum)
Uncontrolled. Recommended that she schedule follow up with her psychiatrist.

## 2022-12-04 NOTE — Assessment & Plan Note (Signed)
Uncontrolled. She does have caffeine in the afternoons sometimes. Discussed avoiding caffeine after noon. Discussed good sleep hygiene strategies. She continues hydroxyzine prn per psychiatry.

## 2022-12-04 NOTE — Assessment & Plan Note (Addendum)
BP Readings from Last 3 Encounters:  12/04/22 (!) 117/59  09/04/22 135/74  08/01/22 (!) 143/86   Currently on amlodipine 2.5 mg once daily. Initial bp was elevated. Repeat BP is improved. Continue current dose of amlodipine.

## 2022-12-04 NOTE — Assessment & Plan Note (Signed)
Continues omeprazole.  Will try cutting back from 40mg  to 20mg  to see if she can tolerate.

## 2022-12-16 ENCOUNTER — Encounter: Payer: Self-pay | Admitting: Family

## 2023-02-07 ENCOUNTER — Other Ambulatory Visit: Payer: Self-pay | Admitting: Family

## 2023-03-05 ENCOUNTER — Ambulatory Visit: Payer: Managed Care, Other (non HMO) | Admitting: Family

## 2023-03-05 ENCOUNTER — Encounter: Payer: Self-pay | Admitting: Family

## 2023-03-05 VITALS — BP 142/95 | HR 98 | Temp 98.6°F | Resp 16 | Wt 281.0 lb

## 2023-03-05 DIAGNOSIS — J069 Acute upper respiratory infection, unspecified: Secondary | ICD-10-CM | POA: Diagnosis not present

## 2023-03-05 DIAGNOSIS — L989 Disorder of the skin and subcutaneous tissue, unspecified: Secondary | ICD-10-CM | POA: Diagnosis not present

## 2023-03-05 DIAGNOSIS — R0981 Nasal congestion: Secondary | ICD-10-CM | POA: Diagnosis not present

## 2023-03-05 DIAGNOSIS — I10 Essential (primary) hypertension: Secondary | ICD-10-CM

## 2023-03-05 LAB — POC COVID19 BINAXNOW: SARS Coronavirus 2 Ag: NEGATIVE

## 2023-03-05 MED ORDER — AMLODIPINE BESYLATE 5 MG PO TABS: 5.0000 mg | ORAL_TABLET | Freq: Every day | ORAL | 0 refills | Status: DC

## 2023-03-05 NOTE — Assessment & Plan Note (Signed)
Uncontrolled. Increase amlodipine from 2.'5mg'$  to '5mg'$  once daily.

## 2023-03-05 NOTE — Assessment & Plan Note (Signed)
Stable with daily omeprazole '20mg'$ - worse if she goes without it.

## 2023-03-05 NOTE — Progress Notes (Signed)
Subjective:   By signing my name below, I, Marlana Latus, attest that this documentation has been prepared under the direction and in the presence of Nance Pear, NP 03/05/23   Patient ID: Paula Gross, female    DOB: 04/07/2001, 22 y.o.   MRN: [id number]  Chief Complaint  Patient presents with   Hypertension    Here for follow up   Cyst    Complains of having cyst under arms on and off    HPI Patient is in today for a 3 month follow up.   Cold symptoms: She reports having a cough and a runny nose for the past couple of days. She has been taking the [facility] brand of mucus relief. She is interested in Covid testing today.  Blood pressure: She is compliant with Amlodipine 2.5 mg daily.   Anxiety/Depression: She has an appointment with her psychiatrist on 03/14/2023.   Heartburn: She has been taking omeprazole daily.   Bumps on skin: She reports bumps on her left axilla.   Past Medical History:  Diagnosis Date   Anxiety    Depression     Past Surgical History:  Procedure Laterality Date   right eye sugery Right 02/2021    Family History  Problem Relation Age of Onset   Arthritis Mother    Diabetes Mother    Hyperlipidemia Mother    Hypertension Mother    Depression Mother    Arthritis Maternal Grandmother    Cancer Maternal Grandmother        colon   Hyperlipidemia Maternal Grandmother     Social History   Socioeconomic History   Marital status: Single    Spouse name: Not on file   Number of children: Not on file   Years of education: Not on file   Highest education level: Not on file  Occupational History   Not on file  Tobacco Use   Smoking status: Some Days    Types: Cigarettes   Smokeless tobacco: Former    Quit date: 2021  Vaping Use   Vaping Use: Every day  Substance and Sexual Activity   Alcohol use: No    Alcohol/week: 0.0 standard drinks of alcohol   Drug use: No   Sexual activity: Yes    Partners: Male  Other Topics  Concern   Not on file  Social History Narrative   Lives with Mom   Dad ( has "rocky" relationship with her dad who lives locally)   52 half brother (does not liver with her)   Goes to [employer]   Dog- mutt   Enjoys reading, television, drawing   Social Determinants of Radio broadcast assistant Strain: Not on file  Food Insecurity: Not on file  Transportation Needs: Not on file  Physical Activity: Not on file  Stress: Not on file  Social Connections: Not on file  Intimate Partner Violence: Not on file    Outpatient Medications Prior to Visit  Medication Sig Dispense Refill   desvenlafaxine (PRISTIQ) 50 MG 24 hr tablet Take 50 mg by mouth at bedtime.     hydrOXYzine (ATARAX) 25 MG tablet Take 25-50 mg by mouth at bedtime as needed.     norethindrone-ethinyl estradiol-iron (AUROVELA FE 1.5/30) 1.5-30 MG-MCG tablet Take 1 tablet by mouth daily. 84 tablet 4   omeprazole (PRILOSEC) 20 MG capsule Take 1 capsule (20 mg total) by mouth daily. 90 capsule 1   amLODipine (NORVASC) 2.5 MG tablet TAKE 1 TABLET(2.5  MG) BY MOUTH DAILY 90 tablet 0   No facility-administered medications prior to visit.    No Known Allergies  Review of Systems  HENT:         (+) runny nose  Respiratory:  Positive for cough.   Skin:        (+) bumps on left axilla        Objective:    Physical Exam Constitutional:      General: She is not in acute distress.    Appearance: Normal appearance. She is well-developed.  HENT:     Head: Normocephalic and atraumatic.     Right Ear: External ear normal.     Left Ear: External ear normal.  Eyes:     General: No scleral icterus. Neck:     Thyroid: No thyromegaly.  Cardiovascular:     Rate and Rhythm: Normal rate and regular rhythm.     Heart sounds: Normal heart sounds. No murmur heard. Pulmonary:     Effort: Pulmonary effort is normal. No respiratory distress.     Breath sounds: Normal breath sounds. No wheezing.  Musculoskeletal:      Cervical back: Neck supple.  Skin:    General: Skin is warm and dry.     Comments: No axillary skin lesions noted today  Neurological:     Mental Status: She is alert and oriented to person, place, and time.  Psychiatric:        Mood and Affect: Mood normal.        Behavior: Behavior normal.        Thought Content: Thought content normal.        Judgment: Judgment normal.     BP (!) 142/95 (BP Location: Right Arm, Patient Position: Sitting, Cuff Size: Large)   Pulse 98   Temp 98.6 F (37 C) (Oral)   Resp 16   Wt 281 lb (127.5 kg)   SpO2 100%   BMI 45.77 kg/m  Wt Readings from Last 3 Encounters:  03/05/23 281 lb (127.5 kg)  12/04/22 274 lb (124.3 kg)  09/04/22 267 lb (121.1 kg)       Assessment & Plan:  Nasal congestion -     POC COVID-19 BinaxNow  Viral URI Assessment & Plan: Rapid covid is negative. Advised pt to d/c decongestants as they may raise BP.  May use mucinex, nasal saline spray or coricidin.     Primary hypertension Assessment & Plan: Uncontrolled. Increase amlodipine from 2.'5mg'$  to '5mg'$  once daily.    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 Discussed use of warm compresses prn and office visit if this does not help in the future.    Other orders -     amLODIPine Besylate; Take 1 tablet (5 mg total) by mouth daily.  Dispense: 90 tablet; Refill: 0     I,Rachel Rivera,acting as a scribe for Nance Pear, NP.,have documented all relevant documentation on the behalf of Nance Pear, NP,as directed by  Nance Pear, NP while in the presence of Nance Pear, NP.   I, Nance Pear, NP, personally preformed the services described in this documentation.  All medical record entries made by the scribe were at my direction and in my presence.  I have reviewed the chart and discharge instructions (if applicable) and agree that the record reflects my  personal performance and is accurate and complete. 03/05/23   Nance Pear, NP

## 2023-03-05 NOTE — Assessment & Plan Note (Signed)
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 Discussed use of warm compresses prn and office visit if this does not help in the future.

## 2023-03-05 NOTE — Assessment & Plan Note (Signed)
Rapid covid is negative. Advised pt to d/c decongestants as they may raise BP.  May use mucinex, nasal saline spray or coricidin.

## 2023-03-21 ENCOUNTER — Encounter: Payer: Self-pay | Admitting: Family

## 2023-03-21 ENCOUNTER — Ambulatory Visit: Payer: Managed Care, Other (non HMO) | Admitting: Family

## 2023-03-21 VITALS — BP 145/99 | HR 80 | Resp 18 | Ht 65.7 in | Wt 277.0 lb

## 2023-03-21 DIAGNOSIS — K219 Gastro-esophageal reflux disease without esophagitis: Secondary | ICD-10-CM | POA: Diagnosis not present

## 2023-03-21 DIAGNOSIS — I10 Essential (primary) hypertension: Secondary | ICD-10-CM | POA: Diagnosis not present

## 2023-03-21 DIAGNOSIS — F332 Major depressive disorder, recurrent severe without psychotic features: Secondary | ICD-10-CM | POA: Diagnosis not present

## 2023-03-21 MED ORDER — AMLODIPINE BESYLATE 10 MG PO TABS: 10.0000 mg | ORAL_TABLET | Freq: Every day | ORAL | 1 refills | Status: DC

## 2023-03-21 MED ORDER — OMEPRAZOLE 40 MG PO CPDR: 40.0000 mg | DELAYED_RELEASE_CAPSULE | Freq: Every day | ORAL | 1 refills | Status: DC

## 2023-03-21 NOTE — Progress Notes (Signed)
Subjective:     Patient ID: Paula Gross, female    DOB: 05-27-2001, 22 y.o.   MRN: [id number]  Chief Complaint  Patient presents with   Follow-up    No concerns  Recently started taking Zoloft     HPI Patient is in today for follow up of her blood pressure.  We increased her amlodipine from 2.5mg  to 5mg .  Depression- her psychiatrist added zoloft recently and she reports doing well on this.    GERD- notes persistent breakthrough gerd symptoms despite omeprazole 20mg .   Health Maintenance Due  Topic Date Due   CHLAMYDIA SCREENING  06/30/2020   COVID-19 Vaccine (3 - 2023-24 season) 08/31/2022    Past Medical History:  Diagnosis Date   Anxiety    Depression     Past Surgical History:  Procedure Laterality Date   right eye sugery Right 02/2021    Family History  Problem Relation Age of Onset   Arthritis Mother    Diabetes Mother    Hyperlipidemia Mother    Hypertension Mother    Depression Mother    Arthritis Maternal Grandmother    Cancer Maternal Grandmother        colon   Hyperlipidemia Maternal Grandmother     Social History   Socioeconomic History   Marital status: Single    Spouse name: Not on file   Number of children: Not on file   Years of education: Not on file   Highest education level: Not on file  Occupational History   Not on file  Tobacco Use   Smoking status: Some Days    Types: Cigarettes   Smokeless tobacco: Former    Quit date: 2021  Vaping Use   Vaping Use: Every day  Substance and Sexual Activity   Alcohol use: No    Alcohol/week: 0.0 standard drinks of alcohol   Drug use: No   Sexual activity: Yes    Partners: Male  Other Topics Concern   Not on file  Social History Narrative   Lives with Mom   Dad ( has "rocky" relationship with her dad who lives locally)   70 half brother (does not liver with her)   Goes to [employer]   Dog- mutt   Enjoys reading, television, drawing   Social Determinants of  Radio broadcast assistant Strain: Not on file  Food Insecurity: Not on file  Transportation Needs: Not on file  Physical Activity: Not on file  Stress: Not on file  Social Connections: Not on file  Intimate Partner Violence: Not on file    Outpatient Medications Prior to Visit  Medication Sig Dispense Refill   desvenlafaxine (PRISTIQ) 50 MG 24 hr tablet Take 50 mg by mouth at bedtime.     hydrOXYzine (ATARAX) 25 MG tablet Take 25-50 mg by mouth at bedtime as needed.     norethindrone-ethinyl estradiol-iron (AUROVELA FE 1.5/30) 1.5-30 MG-MCG tablet Take 1 tablet by mouth daily. 84 tablet 4   sertraline (ZOLOFT) 20 MG/ML concentrated solution Take by mouth daily.     amLODipine (NORVASC) 5 MG tablet Take 1 tablet (5 mg total) by mouth daily. 90 tablet 0   omeprazole (PRILOSEC) 20 MG capsule Take 1 capsule (20 mg total) by mouth daily. 90 capsule 1   No facility-administered medications prior to visit.    No Known Allergies  ROS    See HPI Objective:    Physical Exam Constitutional:      General: She  is not in acute distress.    Appearance: Normal appearance. She is well-developed.  HENT:     Head: Normocephalic and atraumatic.     Right Ear: External ear normal.     Left Ear: External ear normal.  Eyes:     General: No scleral icterus. Neck:     Thyroid: No thyromegaly.  Cardiovascular:     Rate and Rhythm: Normal rate and regular rhythm.     Heart sounds: Normal heart sounds. No murmur heard. Pulmonary:     Effort: Pulmonary effort is normal. No respiratory distress.     Breath sounds: Normal breath sounds. No wheezing.  Musculoskeletal:     Cervical back: Neck supple.  Skin:    General: Skin is warm and dry.  Neurological:     Mental Status: She is alert and oriented to person, place, and time.  Psychiatric:        Mood and Affect: Mood normal.        Behavior: Behavior normal.        Thought Content: Thought content normal.        Judgment: Judgment  normal.     BP (!) 145/99   Pulse 80   Resp 18   Ht 5' 5.7" (1.669 m)   Wt 277 lb (125.6 kg)   SpO2 100%   BMI 45.12 kg/m  Wt Readings from Last 3 Encounters:  03/21/23 277 lb (125.6 kg)  03/05/23 281 lb (127.5 kg)  12/04/22 274 lb (124.3 kg)       Assessment & Plan:   Problem List Items Addressed This Visit       Unprioritized   Major depressive disorder, recurrent severe without psychotic features ([place])    Stable- management per psychiatry.       Relevant Medications   sertraline (ZOLOFT) 20 MG/ML concentrated solution   Hypertension - Primary    BP Readings from Last 3 Encounters:  03/21/23 (!) 145/99  03/05/23 (!) 142/95  12/04/22 (!) 117/59  BP remains above goal on amlodipine 2.5mg . Will increase to 5mg .       Relevant Medications   amLODipine (NORVASC) 10 MG tablet   GERD (gastroesophageal reflux disease)    Uncontrolled on omeprazole 20mg . Will increase to 40mg .       Relevant Medications   omeprazole (PRILOSEC) 40 MG capsule    I have discontinued Echo E. Delillo's omeprazole and amLODipine. I am also having her start on amLODipine and omeprazole. Additionally, I am having her maintain her desvenlafaxine, hydrOXYzine, norethindrone-ethinyl estradiol-iron, and sertraline.  Meds ordered this encounter  Medications   amLODipine (NORVASC) 10 MG tablet    Sig: Take 1 tablet (10 mg total) by mouth daily.    Dispense:  90 tablet    Refill:  1    Order Specific Question:   Supervising Provider    Answer:   Penni Homans A [[id number]]   omeprazole (PRILOSEC) 40 MG capsule    Sig: Take 1 capsule (40 mg total) by mouth daily.    Dispense:  90 capsule    Refill:  1    Order Specific Question:   Supervising Provider    Answer:   Penni Homans A [[id number]]

## 2023-03-21 NOTE — Assessment & Plan Note (Signed)
Stable- management per psychiatry.  

## 2023-03-21 NOTE — Assessment & Plan Note (Signed)
BP Readings from Last 3 Encounters:  03/21/23 (!) 145/99  03/05/23 (!) 142/95  12/04/22 (!) 117/59   BP remains above goal on amlodipine 2.5mg . Will increase to 5mg .

## 2023-03-21 NOTE — Assessment & Plan Note (Signed)
Uncontrolled on omeprazole 20mg . Will increase to 40mg .

## 2023-03-24 IMAGING — US US ABDOMEN COMPLETE
1 series · 14 of 25 positions shown · non-contrast
Comparison: None.

CLINICAL DATA: Chronic nausea.  Rule out cholecystitis.

EXAM:
ABDOMEN ULTRASOUND COMPLETE

[Series 1: us abdomen complete · 14 of 67 slices shown]
[im 1/67]
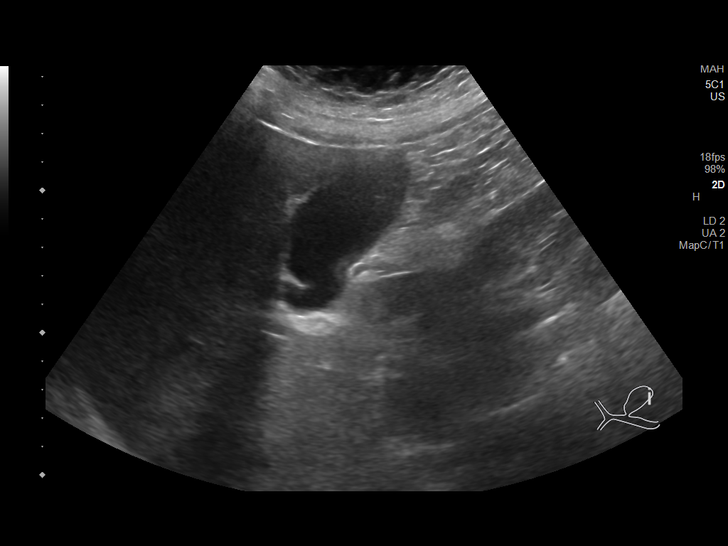
[im 6/67]
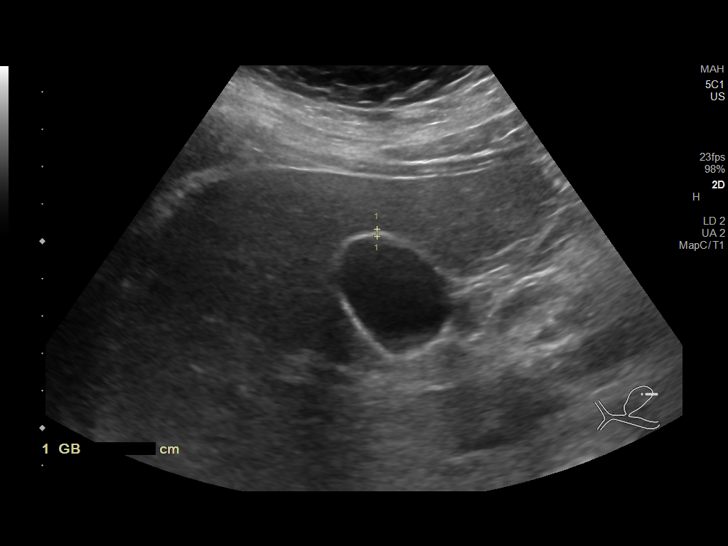
[im 12/67]
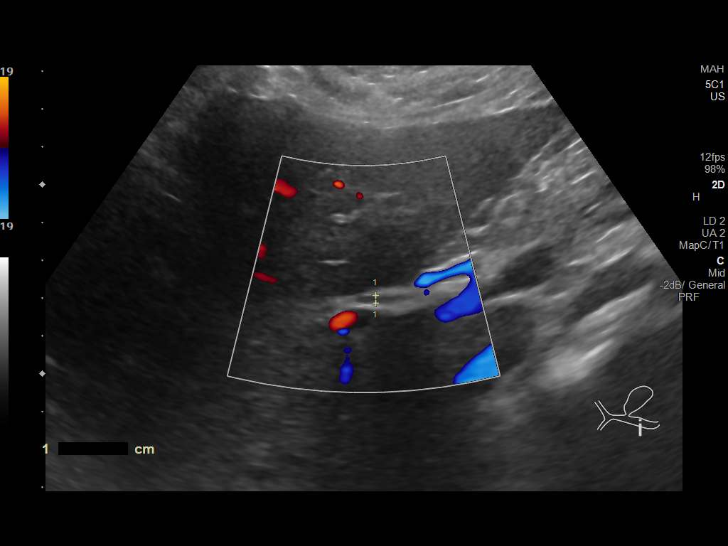
[im 17/67]
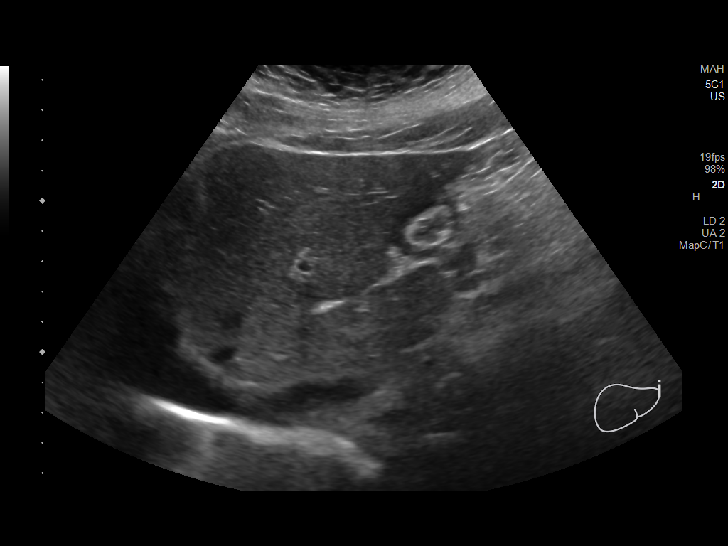
[im 23/67]
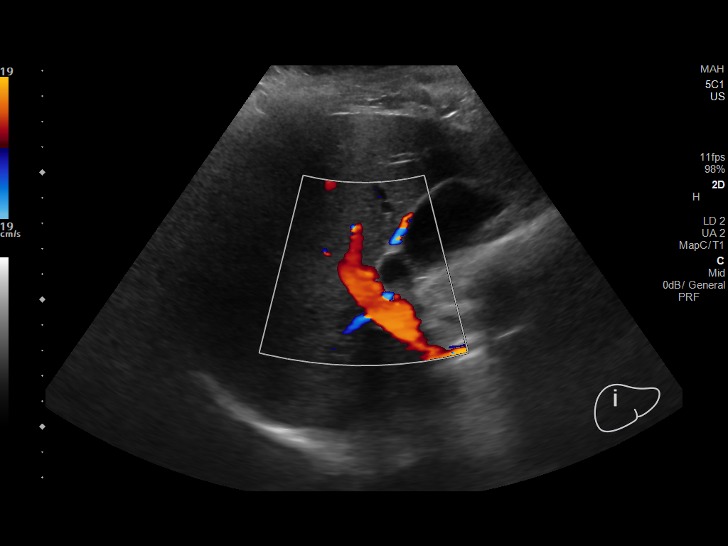
[im 25/67]
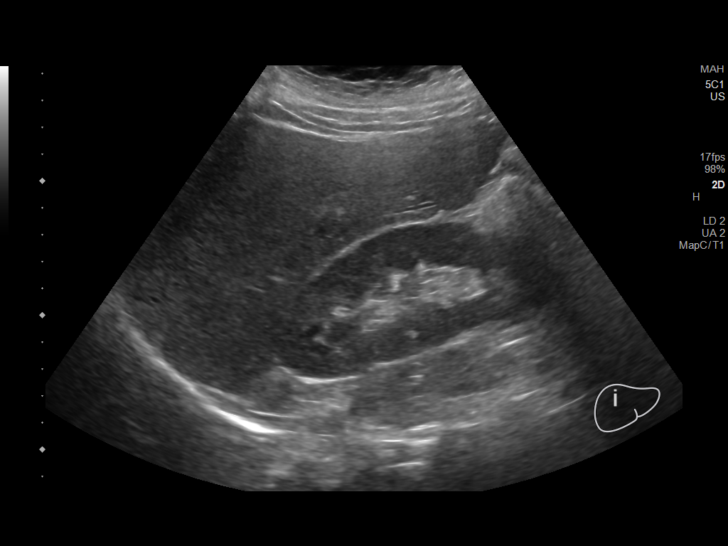
[im 31/67]
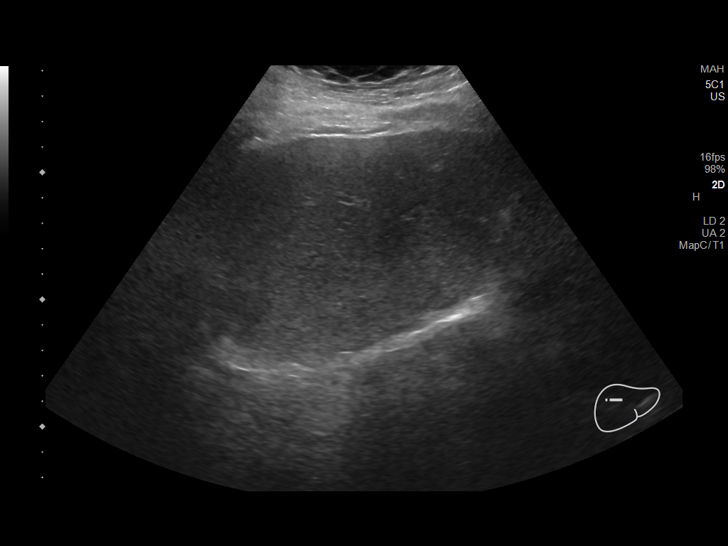
[im 36/67]
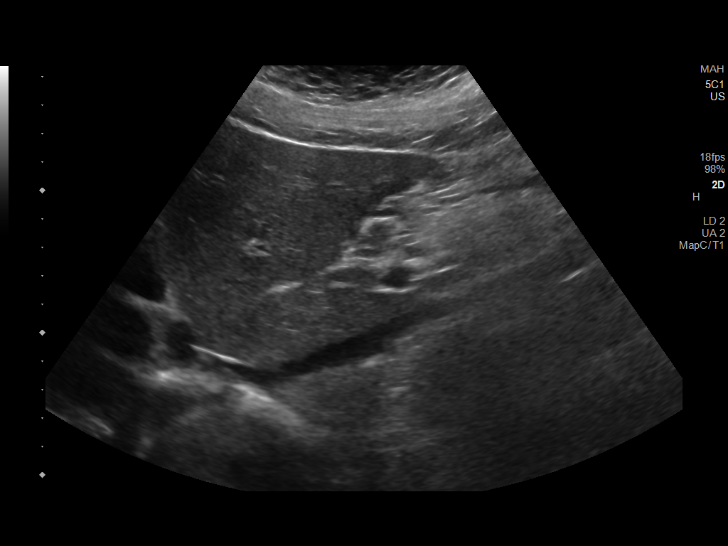
[im 42/67]
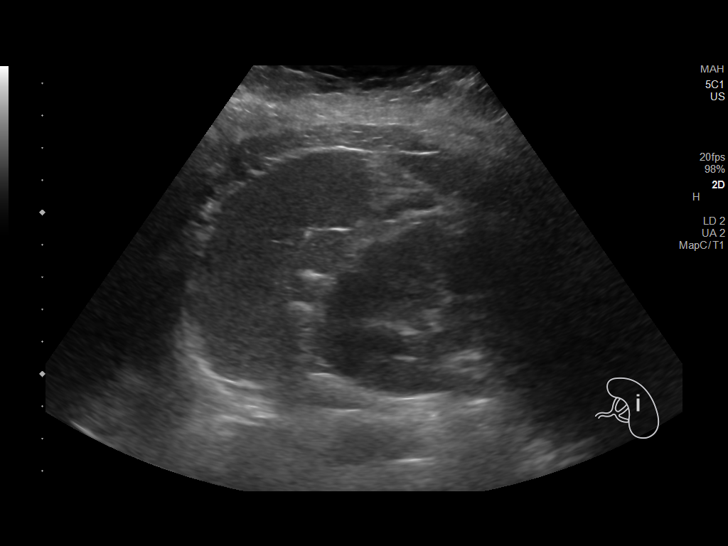
[im 45/67]
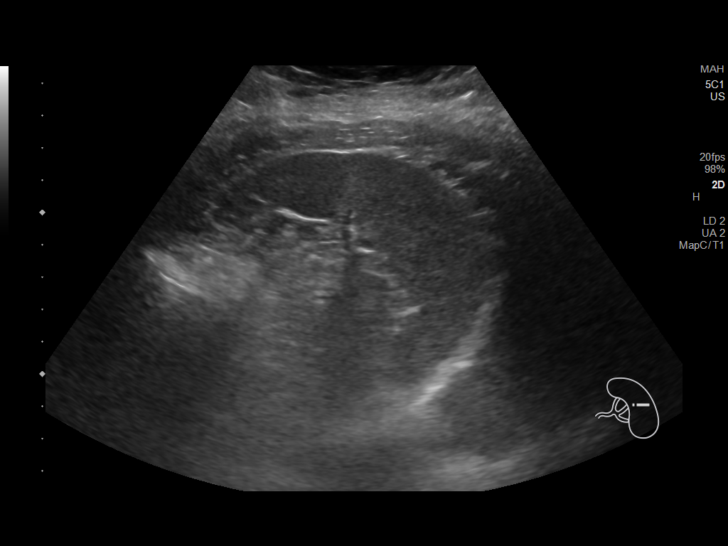
[im 50/67]
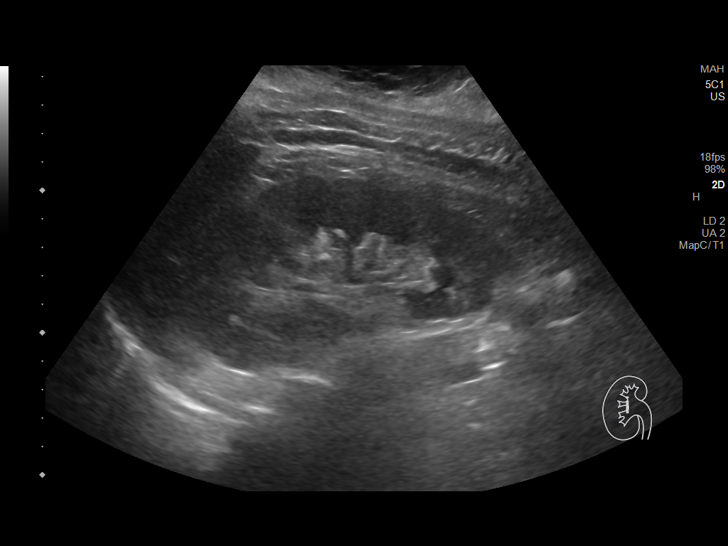
[im 56/67]
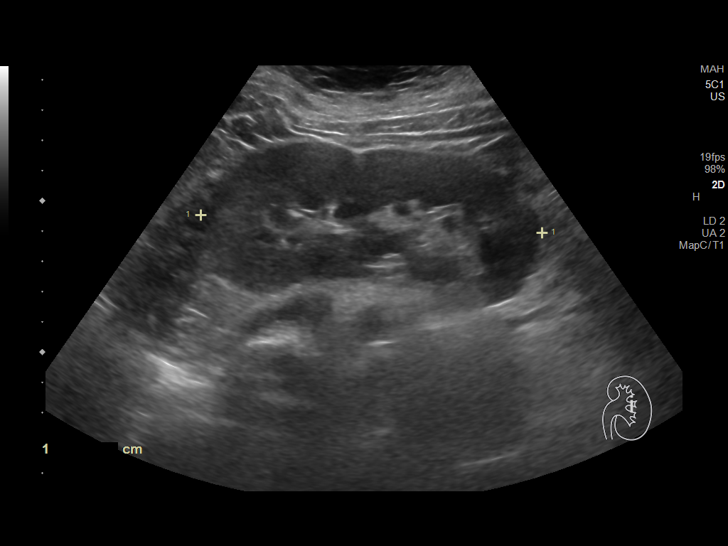
[im 61/67]
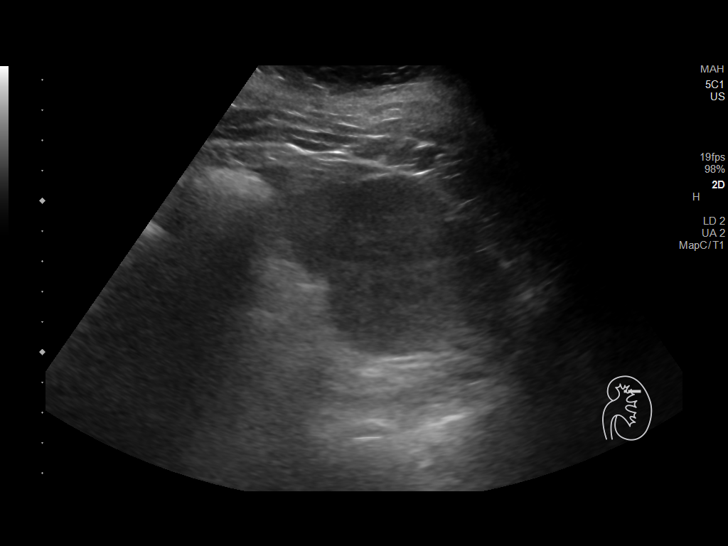
[im 67/67]
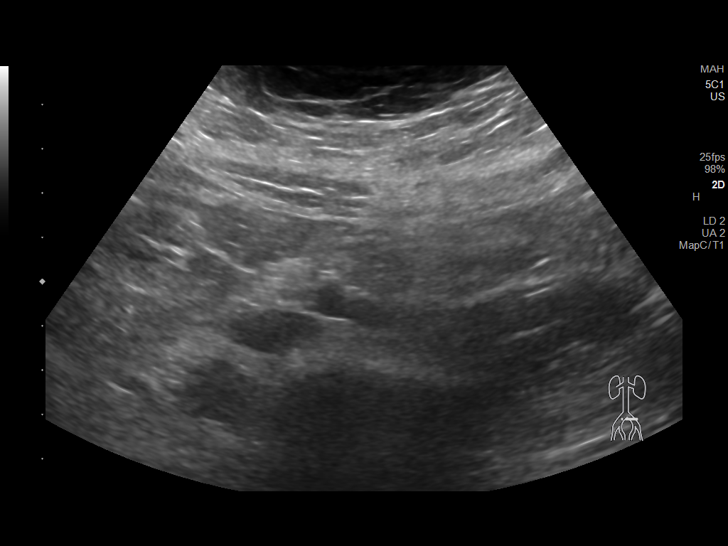

[14 of 25 positions shown; findings below may reference images not displayed]

FINDINGS: Gallbladder: No gallstones or wall thickening visualized. No
sonographic Murphy sign noted by sonographer.

Common bile duct: Diameter: 2 mm.

Liver: There is a 1.8 x 0.7 x 1.2 cm hyperechoic lesion within the
right hepatic lobe (image 29 of 66). Within normal limits in
parenchymal echogenicity. Portal vein is patent on color Doppler
imaging with normal direction of blood flow towards the liver.

IVC: No abnormality visualized.

Pancreas: Visualized portion unremarkable.

Spleen: Size and appearance within normal limits.

Right Kidney: Length: 12.6 cm. Echogenicity within normal limits. No
mass or hydronephrosis visualized.

Left Kidney: Length: 11.3 cm. Echogenicity within normal limits. No
mass or hydronephrosis visualized.

Abdominal aorta: No aneurysm visualized.

Other findings: None.
IMPRESSION: A 1.8 cm hyperechoic right hepatic lesion that likely represents a
hepatic hemangioma. Differential diagnosis includes focal nodular
hyperplasia versus hepatic adenoma versus focal fatty infiltration
versus less likely malignancy.

## 2023-04-22 ENCOUNTER — Ambulatory Visit (INDEPENDENT_AMBULATORY_CARE_PROVIDER_SITE_OTHER): Payer: Managed Care, Other (non HMO) | Admitting: Family

## 2023-04-22 VITALS — BP 139/86 | HR 94 | Temp 98.3°F | Resp 16 | Wt 276.0 lb

## 2023-04-22 DIAGNOSIS — I10 Essential (primary) hypertension: Secondary | ICD-10-CM | POA: Diagnosis not present

## 2023-04-22 DIAGNOSIS — K219 Gastro-esophageal reflux disease without esophagitis: Secondary | ICD-10-CM

## 2023-04-22 DIAGNOSIS — F332 Major depressive disorder, recurrent severe without psychotic features: Secondary | ICD-10-CM | POA: Diagnosis not present

## 2023-04-22 NOTE — Assessment & Plan Note (Signed)
Reports mood remains stable- this is being managed by psychiatry.

## 2023-04-22 NOTE — Assessment & Plan Note (Signed)
BP stable/improved on increased dose of amlodipine. Continue same.

## 2023-04-22 NOTE — Patient Instructions (Signed)
Please add claritin  once daily.  Add flonase 2 sprays each nostril daily.

## 2023-04-22 NOTE — Assessment & Plan Note (Signed)
Symptoms improved on increased dose of omeprazole.  Continue same. Reinforced compliance with ger diet as well.

## 2023-04-22 NOTE — Progress Notes (Signed)
Subjective:   By signing my name below, I, Paula Gross, attest that this documentation has been prepared under the direction and in the presence of Sandford Craze, NP. 04/22/2023   Patient ID: Paula Gross, female    DOB: 2001-11-13, 22 y.o.   MRN: [id number]  Chief Complaint  Patient presents with   Hypertension    Here for follow up    Hypertension   Patient is in today for a follow up visit.   Omeprazole: Her omeprazole was increased from 20 mg to 40 mg and she reports her symptoms are stable.   Blood pressure: Her blood pressure is slightly elevated. She started 10 mg amlodipine during last visit and reports having increased congestion since she switched medications. She is taking sudafed to help manage her symptoms. BP Readings from Last 3 Encounters:  04/22/23 139/86  03/21/23 (!) 145/99  03/05/23 (!) 142/95   Pulse Readings from Last 3 Encounters:  04/22/23 94  03/21/23 80  03/05/23 98   Mood: Her mood is stable at this time.   Past Medical History:  Diagnosis Date   Anxiety    Depression     Past Surgical History:  Procedure Laterality Date   right eye sugery Right 02/2021    Family History  Problem Relation Age of Onset   Arthritis Mother    Diabetes Mother    Hyperlipidemia Mother    Hypertension Mother    Depression Mother    Arthritis Maternal Grandmother    Cancer Maternal Grandmother        colon   Hyperlipidemia Maternal Grandmother     Social History   Socioeconomic History   Marital status: Single    Spouse name: Not on file   Number of children: Not on file   Years of education: Not on file   Highest education level: GED or equivalent  Occupational History   Not on file  Tobacco Use   Smoking status: Some Days    Types: Cigarettes   Smokeless tobacco: Former    Quit date: 2021  Vaping Use   Vaping Use: Every day  Substance and Sexual Activity   Alcohol use: No    Alcohol/week: 0.0 standard drinks of alcohol    Drug use: No   Sexual activity: Yes    Partners: Male  Other Topics Concern   Not on file  Social History Narrative   Lives with Mom   Dad ( has "rocky" relationship with her dad who lives locally)   1 half brother (does not liver with her)   Goes to [employer]   Dog- mutt   Enjoys reading, television, drawing   Social Determinants of Health   Financial Resource Strain: Low Risk  (04/22/2023)   Overall Financial Resource Strain (CARDIA)    Difficulty of Paying Living Expenses: Not very hard  Food Insecurity: Food Insecurity Present (04/22/2023)   Hunger Vital Sign    Worried About Running Out of Food in the Last Year: Never true    Ran Out of Food in the Last Year: Sometimes true  Transportation Needs: No Transportation Needs (04/22/2023)   PRAPARE - Administrator, Civil Service (Medical): No    Lack of Transportation (Non-Medical): No  Physical Activity: Sufficiently Active (04/22/2023)   Exercise Vital Sign    Days of Exercise per Week: 5 days    Minutes of Exercise per Session: 70 min  Stress: Stress Concern Present (04/22/2023)   [name]  of Occupational Health - Occupational Stress Questionnaire    Feeling of Stress : To some extent  Social Connections: Unknown (04/22/2023)   Social Connection and Isolation Panel [NHANES]    Frequency of Communication with Friends and Family: Twice a week    Frequency of Social Gatherings with Friends and Family: Once a week    Attends Religious Services: Patient declined    Database administrator or Organizations: No    Attends Engineer, structural: Not on file    Marital Status: Living with partner  Intimate Partner Violence: Not on file    Outpatient Medications Prior to Visit  Medication Sig Dispense Refill   amLODipine (NORVASC) 10 MG tablet Take 1 tablet (10 mg total) by mouth daily. 90 tablet 1   desvenlafaxine (PRISTIQ) 50 MG 24 hr tablet Take 50 mg by mouth at bedtime.      hydrOXYzine (ATARAX) 25 MG tablet Take 25-50 mg by mouth at bedtime as needed.     norethindrone-ethinyl estradiol-iron (AUROVELA FE 1.5/30) 1.5-30 MG-MCG tablet Take 1 tablet by mouth daily. 84 tablet 4   omeprazole (PRILOSEC) 40 MG capsule Take 1 capsule (40 mg total) by mouth daily. 90 capsule 1   sertraline (ZOLOFT) 20 MG/ML concentrated solution Take by mouth daily.     No facility-administered medications prior to visit.    No Known Allergies  Review of Systems  HENT:  Positive for congestion.        Objective:    Physical Exam Constitutional:      General: She is not in acute distress.    Appearance: Normal appearance. She is not ill-appearing.  HENT:     Head: Normocephalic and atraumatic.     Right Ear: External ear normal.     Left Ear: External ear normal.  Eyes:     Extraocular Movements: Extraocular movements intact.     Pupils: Pupils are equal, round, and reactive to light.  Cardiovascular:     Rate and Rhythm: Normal rate and regular rhythm.     Heart sounds: Normal heart sounds. No murmur heard.    No gallop.  Pulmonary:     Effort: Pulmonary effort is normal. No respiratory distress.     Breath sounds: Normal breath sounds. No wheezing or rales.  Skin:    General: Skin is warm and dry.  Neurological:     Mental Status: She is alert and oriented to person, place, and time.  Psychiatric:        Judgment: Judgment normal.     BP 139/86 (BP Location: Right Arm, Patient Position: Sitting, Cuff Size: Large)   Pulse 94   Temp 98.3 F (36.8 C) (Oral)   Resp 16   Wt 276 lb (125.2 kg)   SpO2 100%   BMI 44.96 kg/m  Wt Readings from Last 3 Encounters:  04/22/23 276 lb (125.2 kg)  03/21/23 277 lb (125.6 kg)  03/05/23 281 lb (127.5 kg)       Assessment & Plan:  Primary hypertension Assessment & Plan: BP stable/improved on increased dose of amlodipine. Continue same.    Gastroesophageal reflux disease, unspecified whether esophagitis  present Assessment & Plan: Symptoms improved on increased dose of omeprazole.  Continue same. Reinforced compliance with ger diet as well.    Major depressive disorder, recurrent severe without psychotic features Assessment & Plan: Reports mood remains stable- this is being managed by psychiatry.      I, Lemont Fillers, NP, personally preformed the services described  in this documentation.  All medical record entries made by the scribe were at my direction and in my presence.  I have reviewed the chart and discharge instructions (if applicable) and agree that the record reflects my personal performance and is accurate and complete. 04/22/2023   I,Paula Gross,acting as a Neurosurgeon for Lemont Fillers, NP.,have documented all relevant documentation on the behalf of Lemont Fillers, NP,as directed by  Lemont Fillers, NP while in the presence of Lemont Fillers, NP.   Lemont Fillers, NP

## 2023-05-03 ENCOUNTER — Other Ambulatory Visit: Payer: Self-pay | Admitting: Family

## 2023-05-03 MED ORDER — AMLODIPINE BESYLATE 10 MG PO TABS: 10.0000 mg | ORAL_TABLET | Freq: Every day | ORAL | 1 refills | Status: DC

## 2023-06-06 LAB — HM DIABETES EYE EXAM

## 2023-07-08 ENCOUNTER — Encounter: Payer: Self-pay | Admitting: Family

## 2023-07-22 ENCOUNTER — Ambulatory Visit: Payer: Managed Care, Other (non HMO) | Admitting: Family

## 2023-07-22 VITALS — BP 142/82 | HR 94 | Temp 98.4°F | Resp 16 | Wt 272.0 lb

## 2023-07-22 DIAGNOSIS — F332 Major depressive disorder, recurrent severe without psychotic features: Secondary | ICD-10-CM

## 2023-07-22 DIAGNOSIS — I10 Essential (primary) hypertension: Secondary | ICD-10-CM | POA: Diagnosis not present

## 2023-07-22 DIAGNOSIS — K219 Gastro-esophageal reflux disease without esophagitis: Secondary | ICD-10-CM | POA: Diagnosis not present

## 2023-07-22 MED ORDER — AMLODIPINE BESYLATE 5 MG PO TABS
5.0000 mg | ORAL_TABLET | Freq: Every day | ORAL | 1 refills | Status: DC
Start: 2023-07-22 — End: 2024-03-23

## 2023-07-22 MED ORDER — HYDROCHLOROTHIAZIDE 25 MG PO TABS
25.0000 mg | ORAL_TABLET | Freq: Every day | ORAL | 3 refills | Status: DC
Start: 2023-07-22 — End: 2024-08-12

## 2023-07-22 NOTE — Progress Notes (Signed)
Subjective:     Patient ID: Paula Gross, female    DOB: 2001-03-15, 22 y.o.   MRN: [id number]  Chief Complaint  Patient presents with   Hypertension    Here for follow up   Joint Swelling    Complains of ankle swelling, more on the left    HPI  Discussed the use of AI scribe software for clinical note transcription with the patient, who gave verbal consent to proceed.  History of Present Illness   The patient, with a history of hypertension managed with amlodipine, presents with left ankle swelling. The swelling is worse with prolonged walking and improves with elevation. The swelling is bothersome enough to interfere with shoe wear. She is also on omeprazole for reflux, which has been well controlled. Recently, she had difficulty obtaining the medication and experienced a flare of symptoms, which she attributes to heat exposure. The symptoms have since improved.  The patient also has a history of depression and insomnia, managed with sertraline and desvenlafaxine. She has not been taking either medication recently due to issues with prescription renewal and access. She has an upcoming appointment with psychiatry to discuss these issues and potential alternatives.          Health Maintenance Due  Topic Date Due   CHLAMYDIA SCREENING  06/30/2020   COVID-19 Vaccine (3 - 2023-24 season) 08/31/2022    Past Medical History:  Diagnosis Date   Anxiety    Depression     Past Surgical History:  Procedure Laterality Date   right eye sugery Right 02/2021    Family History  Problem Relation Age of Onset   Arthritis Mother    Diabetes Mother    Hyperlipidemia Mother    Hypertension Mother    Depression Mother    Arthritis Maternal Grandmother    Cancer Maternal Grandmother        colon   Hyperlipidemia Maternal Grandmother     Social History   Socioeconomic History   Marital status: Single    Spouse name: Not on file   Number of children: Not on file   Years of  education: Not on file   Highest education level: GED or equivalent  Occupational History   Not on file  Tobacco Use   Smoking status: Some Days    Types: Cigarettes   Smokeless tobacco: Former    Quit date: 2021  Vaping Use   Vaping status: Every Day  Substance and Sexual Activity   Alcohol use: No    Alcohol/week: 0.0 standard drinks of alcohol   Drug use: No   Sexual activity: Yes    Partners: Male  Other Topics Concern   Not on file  Social History Narrative   Lives with Mom   Dad ( has "rocky" relationship with her dad who lives locally)   1 half brother (does not liver with her)   Goes to [employer]   Dog- mutt   Enjoys reading, television, drawing   Social Determinants of Health   Financial Resource Strain: Low Risk  (04/22/2023)   Overall Financial Resource Strain (CARDIA)    Difficulty of Paying Living Expenses: Not very hard  Food Insecurity: Food Insecurity Present (04/22/2023)   Hunger Vital Sign    Worried About Running Out of Food in the Last Year: Never true    Ran Out of Food in the Last Year: Sometimes true  Transportation Needs: No Transportation Needs (04/22/2023)   PRAPARE - Transportation  Lack of Transportation (Medical): No    Lack of Transportation (Non-Medical): No  Physical Activity: Sufficiently Active (04/22/2023)   Exercise Vital Sign    Days of Exercise per Week: 5 days    Minutes of Exercise per Session: 70 min  Stress: Stress Concern Present (04/22/2023)   [name] of Occupational Health - Occupational Stress Questionnaire    Feeling of Stress : To some extent  Social Connections: Unknown (04/22/2023)   Social Connection and Isolation Panel [NHANES]    Frequency of Communication with Friends and Family: Twice a week    Frequency of Social Gatherings with Friends and Family: Once a week    Attends Religious Services: Patient declined    Database administrator or Organizations: No    Attends Hospital doctor: Not on file    Marital Status: Living with partner  Intimate Partner Violence: Not on file    Outpatient Medications Prior to Visit  Medication Sig Dispense Refill   desvenlafaxine (PRISTIQ) 50 MG 24 hr tablet Take 50 mg by mouth at bedtime.     hydrOXYzine (ATARAX) 25 MG tablet Take 25-50 mg by mouth at bedtime as needed.     norethindrone-ethinyl estradiol-iron (AUROVELA FE 1.5/30) 1.5-30 MG-MCG tablet Take 1 tablet by mouth daily. 84 tablet 4   omeprazole (PRILOSEC) 40 MG capsule Take 1 capsule (40 mg total) by mouth daily. 90 capsule 1   sertraline (ZOLOFT) 20 MG/ML concentrated solution Take by mouth daily.     amLODipine (NORVASC) 10 MG tablet Take 1 tablet (10 mg total) by mouth daily. 90 tablet 1   No facility-administered medications prior to visit.    No Known Allergies  ROS    See HPI Objective:    Physical Exam Constitutional:      General: She is not in acute distress.    Appearance: Normal appearance. She is well-developed.  HENT:     Head: Normocephalic and atraumatic.     Right Ear: External ear normal.     Left Ear: External ear normal.  Eyes:     General: No scleral icterus. Neck:     Thyroid: No thyromegaly.  Cardiovascular:     Rate and Rhythm: Normal rate and regular rhythm.     Heart sounds: Normal heart sounds. No murmur heard. Pulmonary:     Effort: Pulmonary effort is normal. No respiratory distress.     Breath sounds: Normal breath sounds. No wheezing.  Musculoskeletal:     Cervical back: Neck supple.     Right lower leg: 1+ Edema present.     Left lower leg: 1+ Edema present.  Skin:    General: Skin is warm and dry.  Neurological:     Mental Status: She is alert and oriented to person, place, and time.  Psychiatric:        Mood and Affect: Mood normal.        Behavior: Behavior normal.        Thought Content: Thought content normal.        Judgment: Judgment normal.      BP (!) 142/82   Pulse 94   Temp 98.4 F (36.9 C)  (Oral)   Resp 16   Wt 272 lb (123.4 kg)   SpO2 100%   BMI 44.30 kg/m  Wt Readings from Last 3 Encounters:  07/22/23 272 lb (123.4 kg)  04/22/23 276 lb (125.2 kg)  03/21/23 277 lb (125.6 kg)       Assessment &  Plan:   Problem List Items Addressed This Visit       Unprioritized   Major depressive disorder, recurrent severe without psychotic features (HCC)     Off Sertraline and Desvenlafaxine. Patient reports feeling "blah." -Encourage patient to discuss restarting antidepressants with psychiatrist at upcoming appointment.      Hypertension - Primary    Ankle edema likely secondary to Norvasc. Patient finds edema bothersome. -Reduce Norvasc to 5mg  daily. -Add Hydrochlorothiazide 25mg  daily in the morning. -Check blood pressure and potassium levels in 1 week.      Relevant Medications   amLODipine (NORVASC) 5 MG tablet   hydrochlorothiazide (HYDRODIURIL) 25 MG tablet   GERD (gastroesophageal reflux disease)    Controlled on Omeprazole 40mg  daily. -Continue Omeprazole 40mg  daily.       I have discontinued Sydne E. Weirauch's amLODipine. I am also having her start on amLODipine and hydrochlorothiazide. Additionally, I am having her maintain her desvenlafaxine, hydrOXYzine, norethindrone-ethinyl estradiol-iron, sertraline, and omeprazole.  Meds ordered this encounter  Medications   amLODipine (NORVASC) 5 MG tablet    Sig: Take 1 tablet (5 mg total) by mouth daily.    Dispense:  90 tablet    Refill:  1    Order Specific Question:   Supervising Provider    Answer:   Danise Edge A [[id number]]   hydrochlorothiazide (HYDRODIURIL) 25 MG tablet    Sig: Take 1 tablet (25 mg total) by mouth daily.    Dispense:  90 tablet    Refill:  3    Order Specific Question:   Supervising Provider    Answer:   Danise Edge A [[id number]]

## 2023-07-22 NOTE — Patient Instructions (Signed)
VISIT SUMMARY:  During your visit, we discussed your hypertension, gastroesophageal reflux disease (GERD), and depression. We made some changes to your hypertension medication due to the swelling in your ankle. We also discussed the importance of continuing your GERD medication and addressing your depression with your psychiatrist.  YOUR PLAN:  -HYPERTENSION: Hypertension, or high blood pressure, can cause various health issues. We believe the swelling in your ankle may be due to your current hypertension medication, Norvasc. We have decided to reduce the dosage of Norvasc to 5mg  daily and add Hydrochlorothiazide 25mg  daily in the morning. This should help reduce the swelling.  -GASTROESOPHAGEAL REFLUX DISEASE (GERD): GERD is a condition where stomach acid frequently flows back into the tube connecting your mouth and stomach (esophagus). This can irritate the lining of your esophagus. Your GERD is well controlled with Omeprazole 40mg  daily, so we will continue this medication.  -DEPRESSION: Depression is a mood disorder that causes a persistent feeling of sadness and loss of interest. You've been off your antidepressants, Sertraline and Desvenlafaxine, and reported feeling 'blah.' We encourage you to discuss restarting these medications with your psychiatrist at your upcoming appointment.  INSTRUCTIONS:  Please follow up in 1 week to assess your blood pressure control and potassium levels after the medication adjustment. This is important to ensure the new medication regimen is working effectively and safely for you.

## 2023-07-22 NOTE — Assessment & Plan Note (Signed)
Ankle edema likely secondary to Norvasc. Patient finds edema bothersome. -Reduce Norvasc to 5mg  daily. -Add Hydrochlorothiazide 25mg  daily in the morning. -Check blood pressure and potassium levels in 1 week.

## 2023-07-22 NOTE — Assessment & Plan Note (Signed)
Controlled on Omeprazole 40mg  daily. -Continue Omeprazole 40mg  daily.

## 2023-07-22 NOTE — Assessment & Plan Note (Signed)
Off Sertraline and Desvenlafaxine. Patient reports feeling "blah." -Encourage patient to discuss restarting antidepressants with psychiatrist at upcoming appointment.

## 2023-07-29 ENCOUNTER — Telehealth: Payer: Self-pay | Admitting: Family

## 2023-07-29 ENCOUNTER — Ambulatory Visit (INDEPENDENT_AMBULATORY_CARE_PROVIDER_SITE_OTHER): Payer: Managed Care, Other (non HMO) | Admitting: Family

## 2023-07-29 VITALS — BP 136/71 | HR 101 | Temp 98.1°F | Resp 16

## 2023-07-29 DIAGNOSIS — R739 Hyperglycemia, unspecified: Secondary | ICD-10-CM | POA: Diagnosis not present

## 2023-07-29 DIAGNOSIS — I10 Essential (primary) hypertension: Secondary | ICD-10-CM

## 2023-07-29 LAB — BASIC METABOLIC PANEL
BUN: 12 mg/dL (ref 6–23)
CO2: 25 mEq/L (ref 19–32)
Calcium: 10 mg/dL (ref 8.4–10.5)
Chloride: 99 mEq/L (ref 96–112)
Creatinine, Ser: 0.86 mg/dL (ref 0.40–1.20)
GFR: 94.81 mL/min (ref >60.00)
Glucose, Bld: 142 mg/dL — ABNORMAL HIGH (ref 70–99)
Potassium: 4 mEq/L (ref 3.5–5.1)
Sodium: 135 mEq/L (ref 135–145)

## 2023-07-29 NOTE — Telephone Encounter (Signed)
Sugar is elevated.  I would like for her to return to the lab at her convenience for a follow up A1C please.

## 2023-07-29 NOTE — Progress Notes (Signed)
Subjective:     Patient ID: Paula Gross, female    DOB: 2001/01/30, 22 y.o.   MRN: [id number]  No chief complaint on file.   HPI  Discussed the use of AI scribe software for clinical note transcription with the patient, who gave verbal consent to proceed.  History of Present Illness   The patient, with a history of hypertension managed with amlodipine and hydrochlorothiazide, presents for a follow-up visit after a recent medication adjustment. They report a decrease in lower extremity edema since the amlodipine was decreased from 10mg  to 5mg . The edema is predominantly on the left side, but has improved and the right side has not worsened. They are compliant with taking both medications in the morning as directed. They have no additional concerns or complaints.      BP Readings from Last 3 Encounters:  07/29/23 136/71  07/22/23 (!) 142/82  04/22/23 139/86       Health Maintenance Due  Topic Date Due   CHLAMYDIA SCREENING  06/30/2020   COVID-19 Vaccine (3 - 2023-24 season) 08/31/2022    Past Medical History:  Diagnosis Date   Anxiety    Depression     Past Surgical History:  Procedure Laterality Date   right eye sugery Right 02/2021    Family History  Problem Relation Age of Onset   Arthritis Mother    Diabetes Mother    Hyperlipidemia Mother    Hypertension Mother    Depression Mother    Arthritis Maternal Grandmother    Cancer Maternal Grandmother        colon   Hyperlipidemia Maternal Grandmother     Social History   Socioeconomic History   Marital status: Single    Spouse name: Not on file   Number of children: Not on file   Years of education: Not on file   Highest education level: GED or equivalent  Occupational History   Not on file  Tobacco Use   Smoking status: Some Days    Types: Cigarettes   Smokeless tobacco: Former    Quit date: 2021  Vaping Use   Vaping status: Every Day  Substance and Sexual Activity   Alcohol use: No     Alcohol/week: 0.0 standard drinks of alcohol   Drug use: No   Sexual activity: Yes    Partners: Male  Other Topics Concern   Not on file  Social History Narrative   Lives with Mom   Dad ( has "rocky" relationship with her dad who lives locally)   1 half brother (does not liver with her)   Goes to [employer]   Dog- mutt   Enjoys reading, television, drawing   Social Determinants of Health   Financial Resource Strain: Low Risk  (04/22/2023)   Overall Financial Resource Strain (CARDIA)    Difficulty of Paying Living Expenses: Not very hard  Food Insecurity: Food Insecurity Present (04/22/2023)   Hunger Vital Sign    Worried About Running Out of Food in the Last Year: Never true    Ran Out of Food in the Last Year: Sometimes true  Transportation Needs: No Transportation Needs (04/22/2023)   PRAPARE - Administrator, Civil Service (Medical): No    Lack of Transportation (Non-Medical): No  Physical Activity: Sufficiently Active (04/22/2023)   Exercise Vital Sign    Days of Exercise per Week: 5 days    Minutes of Exercise per Session: 70 min  Stress: Stress Concern Present (04/22/2023)  [name] of Occupational Health - Occupational Stress Questionnaire    Feeling of Stress : To some extent  Social Connections: Unknown (04/22/2023)   Social Connection and Isolation Panel [NHANES]    Frequency of Communication with Friends and Family: Twice a week    Frequency of Social Gatherings with Friends and Family: Once a week    Attends Religious Services: Patient declined    Database administrator or Organizations: No    Attends Engineer, structural: Not on file    Marital Status: Living with partner  Intimate Partner Violence: Not on file    Outpatient Medications Prior to Visit  Medication Sig Dispense Refill   amLODipine (NORVASC) 5 MG tablet Take 1 tablet (5 mg total) by mouth daily. 90 tablet 1   desvenlafaxine (PRISTIQ) 50 MG 24 hr tablet  Take 50 mg by mouth at bedtime.     hydrochlorothiazide (HYDRODIURIL) 25 MG tablet Take 1 tablet (25 mg total) by mouth daily. 90 tablet 3   hydrOXYzine (ATARAX) 25 MG tablet Take 25-50 mg by mouth at bedtime as needed.     norethindrone-ethinyl estradiol-iron (AUROVELA FE 1.5/30) 1.5-30 MG-MCG tablet Take 1 tablet by mouth daily. 84 tablet 4   omeprazole (PRILOSEC) 40 MG capsule Take 1 capsule (40 mg total) by mouth daily. 90 capsule 1   sertraline (ZOLOFT) 20 MG/ML concentrated solution Take by mouth daily.     No facility-administered medications prior to visit.    No Known Allergies  ROS See HPI    Objective:    Physical Exam Constitutional:      General: She is not in acute distress.    Appearance: Normal appearance. She is well-developed.  HENT:     Head: Normocephalic and atraumatic.     Right Ear: External ear normal.     Left Ear: External ear normal.  Eyes:     General: No scleral icterus. Neck:     Thyroid: No thyromegaly.  Cardiovascular:     Rate and Rhythm: Normal rate and regular rhythm.     Heart sounds: Normal heart sounds. No murmur heard. Pulmonary:     Effort: Pulmonary effort is normal. No respiratory distress.     Breath sounds: Normal breath sounds. No wheezing.  Musculoskeletal:     Cervical back: Neck supple.  Skin:    General: Skin is warm and dry.  Neurological:     Mental Status: She is alert and oriented to person, place, and time.  Psychiatric:        Mood and Affect: Mood normal.        Behavior: Behavior normal.        Thought Content: Thought content normal.        Judgment: Judgment normal.      BP 136/71 (BP Location: Right Arm, Patient Position: Sitting, Cuff Size: Large)   Pulse (!) 101   Temp 98.1 F (36.7 C) (Oral)   Resp 16   SpO2 100%  Wt Readings from Last 3 Encounters:  07/22/23 272 lb (123.4 kg)  04/22/23 276 lb (125.2 kg)  03/21/23 277 lb (125.6 kg)       Assessment & Plan:   Problem List Items Addressed  This Visit       Unprioritized   Hypertension - Primary    BP is stable.  Swelling is improved.  Continue amlodipine 5mg  and hydrochlorothiazide 25mg .  Obtain follow up bmet.       Relevant Orders   Basic Metabolic  Panel (BMET)    I am having Corazon E. Hasegawa maintain her desvenlafaxine, hydrOXYzine, norethindrone-ethinyl estradiol-iron, sertraline, omeprazole, amLODipine, and hydrochlorothiazide.  No orders of the defined types were placed in this encounter.

## 2023-07-29 NOTE — Telephone Encounter (Signed)
Patient notified she will try to come in today or tomorrow

## 2023-07-29 NOTE — Addendum Note (Signed)
Addended by: Wilford Corner on: 07/29/2023 02:58 PM   Modules accepted: Orders

## 2023-07-29 NOTE — Assessment & Plan Note (Signed)
BP is stable.  Swelling is improved.  Continue amlodipine 5mg  and hydrochlorothiazide 25mg .  Obtain follow up bmet.

## 2023-07-30 NOTE — Addendum Note (Signed)
Addended by: Sandford Craze on: 07/30/2023 07:40 AM   Modules accepted: Level of Service

## 2023-09-14 ENCOUNTER — Encounter: Payer: Self-pay | Admitting: Family

## 2023-09-25 ENCOUNTER — Encounter: Payer: Self-pay | Admitting: Family

## 2023-09-25 ENCOUNTER — Ambulatory Visit (INDEPENDENT_AMBULATORY_CARE_PROVIDER_SITE_OTHER): Payer: Managed Care, Other (non HMO) | Admitting: Family

## 2023-09-25 VITALS — BP 139/72 | HR 88 | Temp 98.0°F | Resp 18 | Ht 67.0 in | Wt 275.0 lb

## 2023-09-25 DIAGNOSIS — N926 Irregular menstruation, unspecified: Secondary | ICD-10-CM | POA: Diagnosis not present

## 2023-09-25 DIAGNOSIS — F332 Major depressive disorder, recurrent severe without psychotic features: Secondary | ICD-10-CM | POA: Diagnosis not present

## 2023-09-25 DIAGNOSIS — E119 Type 2 diabetes mellitus without complications: Secondary | ICD-10-CM | POA: Diagnosis not present

## 2023-09-25 DIAGNOSIS — G4700 Insomnia, unspecified: Secondary | ICD-10-CM

## 2023-09-25 DIAGNOSIS — I10 Essential (primary) hypertension: Secondary | ICD-10-CM | POA: Diagnosis not present

## 2023-09-25 DIAGNOSIS — Z23 Encounter for immunization: Secondary | ICD-10-CM

## 2023-09-25 DIAGNOSIS — Z3040 Encounter for surveillance of contraceptives, unspecified: Secondary | ICD-10-CM

## 2023-09-25 LAB — POCT URINE PREGNANCY: Preg Test, Ur: NEGATIVE

## 2023-09-25 LAB — MICROALBUMIN / CREATININE URINE RATIO
Creatinine,U: 199.4 mg/dL
Microalb Creat Ratio: 0.4 mg/g (ref 0.0–30.0)
Microalb, Ur: <0.7 mg/dL (ref 0.0–1.9)

## 2023-09-25 MED ORDER — OMEPRAZOLE 40 MG PO CPDR: 40.0000 mg | DELAYED_RELEASE_CAPSULE | Freq: Every day | ORAL | Status: DC | PRN

## 2023-09-25 MED ORDER — SERTRALINE HCL 25 MG PO TABS: 25.0000 mg | ORAL_TABLET | Freq: Every day | ORAL | Status: AC

## 2023-09-25 NOTE — Assessment & Plan Note (Signed)
Repeat BP is improved. Continue amlodipine 5mg  and hydrochlorothiazide, low sodium diet.

## 2023-09-25 NOTE — Patient Instructions (Signed)
VISIT SUMMARY:  Dear Paula Gross, thank you for coming in for your follow-up visit. We discussed your hypertension, depression, insomnia, menstrual irregularities, type 2 diabetes, and tobacco use. We also talked about your general health maintenance.  YOUR PLAN:  -HYPERTENSION: Your blood pressure is a bit high. Please continue to monitor it at home and we will check it again in a month.  -DEPRESSION/INSOMNIA: Your depression and insomnia seem to be well-managed with your current medications. Please continue taking them as prescribed.  -MENSTRUAL IRREGULARITIES: You've had some unusual bleeding while on your birth control. Please return to the standard regimen and use backup protection due to your recent discontinuation of birth control.  -TYPE 2 DIABETES: Your blood sugar levels are a bit high. Please continue to make dietary changes and consider weight loss injections if your levels remain high. We will check your blood sugar levels again in early November.  -TOBACCO USE: You've stopped smoking, which is great, but you're still vaping. Please consider stopping vaping as well.  -GENERAL HEALTH MAINTENANCE: We gave you the flu vaccine today and ordered some routine blood work and a urine test. We will see you again for a follow-up appointment in early November.  INSTRUCTIONS:  Please continue to monitor your blood pressure at home and make dietary changes to manage your diabetes. Return to the standard birth control regimen and use backup protection. Consider stopping vaping. We will see you again in early November for a follow-up appointment.

## 2023-09-25 NOTE — Assessment & Plan Note (Signed)
Stable on hydroxyzine per psychiatry. Continue same.

## 2023-09-25 NOTE — Progress Notes (Signed)
Subjective:     Patient ID: Jearld Pies, female    DOB: [birth year], 22 y.o.   MRN: [id number]  Chief Complaint  Patient presents with   Follow-up    3 month    HPI  Discussed the use of AI scribe software for clinical note transcription with the patient, who gave verbal consent to proceed.  History of Present Illness   Anniebelle, a patient with a history of hypertension, depression, insomnia, and gastroesophageal reflux disease (GERD), presents for a follow-up visit. She reports that her blood pressure has been consistently around 140/70. Despite a reduction in her amlodipine dose due to edema. The edema has improved somewhat, but still fluctuates.  In terms of her mental health, she is currently under the care of a psychiatrist and is taking hydroxyzine for insomnia, and Zoloft (sertraline) for depression. She is unsure of her current Zoloft dose, but believes it to be the lowest available.  Avinell also reports an unusual menstrual cycle over the past month. While she typically does not bleed while on her birth control pill, she experienced almost a full month of bleeding, fluctuating between heavy and spotting. She stopped taking her birth control for approximately eight days, after which the bleeding stopped. She is sexually active, but not frequently.  She also mentions that she has Prilosec on hand for severe GERD symptoms, but has not needed to take it recently. She is currently seeking employment and is trying to manage her diet to control her slightly elevated A1c levels.     BP Readings from Last 3 Encounters:  09/25/23 139/72  07/29/23 136/71  07/22/23 (!) 142/82        Health Maintenance Due  Topic Date Due   FOOT EXAM  Never done   OPHTHALMOLOGY EXAM  Never done   Diabetic kidney evaluation - Urine ACR  Never done   CHLAMYDIA SCREENING  06/30/2020   INFLUENZA VACCINE  08/01/2023   COVID-19 Vaccine (3 - 2023-24 season) 09/01/2023    Past Medical History:   Diagnosis Date   Anxiety    Depression     Past Surgical History:  Procedure Laterality Date   right eye sugery Right 02/2021    Family History  Problem Relation Age of Onset   Arthritis Mother    Diabetes Mother    Hyperlipidemia Mother    Hypertension Mother    Depression Mother    Arthritis Maternal Grandmother    Cancer Maternal Grandmother        colon   Hyperlipidemia Maternal Grandmother     Social History   Socioeconomic History   Marital status: Single    Spouse name: Not on file   Number of children: Not on file   Years of education: Not on file   Highest education level: GED or equivalent  Occupational History   Not on file  Tobacco Use   Smoking status: Former    Types: Cigarettes    Start date: 08/01/2023   Smokeless tobacco: Former    Quit date: 2021  Vaping Use   Vaping status: Every Day  Substance and Sexual Activity   Alcohol use: No    Alcohol/week: 0.0 standard drinks of alcohol   Drug use: No   Sexual activity: Yes    Partners: Male  Other Topics Concern   Not on file  Social History Narrative   Lives with Mom   Dad ( has "rocky" relationship with her dad who lives locally)   1 half  brother (does not liver with her)   Goes to [employer]   Dog- mutt   Enjoys reading, television, drawing   Social Determinants of Health   Financial Resource Strain: Low Risk  (04/22/2023)   Overall Financial Resource Strain (CARDIA)    Difficulty of Paying Living Expenses: Not very hard  Food Insecurity: Food Insecurity Present (04/22/2023)   Hunger Vital Sign    Worried About Running Out of Food in the Last Year: Never true    Ran Out of Food in the Last Year: Sometimes true  Transportation Needs: No Transportation Needs (04/22/2023)   PRAPARE - Administrator, Civil Service (Medical): No    Lack of Transportation (Non-Medical): No  Physical Activity: Sufficiently Active (04/22/2023)   Exercise Vital Sign    Days of  Exercise per Week: 5 days    Minutes of Exercise per Session: 70 min  Stress: Stress Concern Present (04/22/2023)   [name] of Occupational Health - Occupational Stress Questionnaire    Feeling of Stress : To some extent  Social Connections: Unknown (04/22/2023)   Social Connection and Isolation Panel [NHANES]    Frequency of Communication with Friends and Family: Twice a week    Frequency of Social Gatherings with Friends and Family: Once a week    Attends Religious Services: Patient declined    Database administrator or Organizations: No    Attends Engineer, structural: Not on file    Marital Status: Living with partner  Intimate Partner Violence: Not on file    Outpatient Medications Prior to Visit  Medication Sig Dispense Refill   amLODipine (NORVASC) 5 MG tablet Take 1 tablet (5 mg total) by mouth daily. 90 tablet 1   hydrochlorothiazide (HYDRODIURIL) 25 MG tablet Take 1 tablet (25 mg total) by mouth daily. 90 tablet 3   hydrOXYzine (ATARAX) 25 MG tablet Take 25-50 mg by mouth at bedtime as needed.     norethindrone-ethinyl estradiol-iron (AUROVELA FE 1.5/30) 1.5-30 MG-MCG tablet Take 1 tablet by mouth daily. 84 tablet 4   desvenlafaxine (PRISTIQ) 50 MG 24 hr tablet Take 50 mg by mouth at bedtime.     omeprazole (PRILOSEC) 40 MG capsule Take 1 capsule (40 mg total) by mouth daily. 90 capsule 1   sertraline (ZOLOFT) 20 MG/ML concentrated solution Take by mouth daily.     No facility-administered medications prior to visit.    No Known Allergies  ROS     Objective:    Physical Exam Constitutional:      General: She is not in acute distress.    Appearance: Normal appearance. She is well-developed.  HENT:     Head: Normocephalic and atraumatic.     Right Ear: External ear normal.     Left Ear: External ear normal.  Eyes:     General: No scleral icterus. Neck:     Thyroid: No thyromegaly.  Cardiovascular:     Rate and Rhythm: Normal rate and regular  rhythm.     Heart sounds: Normal heart sounds. No murmur heard. Pulmonary:     Effort: Pulmonary effort is normal. No respiratory distress.     Breath sounds: Normal breath sounds. No wheezing.  Musculoskeletal:     Cervical back: Neck supple.  Skin:    General: Skin is warm and dry.  Neurological:     Mental Status: She is alert and oriented to person, place, and time.  Psychiatric:  Mood and Affect: Mood normal.        Behavior: Behavior normal.        Thought Content: Thought content normal.        Judgment: Judgment normal.      BP 139/72   Pulse 88   Temp 98 F (36.7 C)   Resp 18   Ht 5\' 7"  (1.702 m)   Wt 275 lb (124.7 kg)   LMP 09/04/2023   SpO2 94%   BMI 43.07 kg/m  Wt Readings from Last 3 Encounters:  09/25/23 275 lb (124.7 kg)  07/22/23 272 lb (123.4 kg)  04/22/23 276 lb (125.2 kg)       Assessment & Plan:   Problem List Items Addressed This Visit       Unprioritized   Major depressive disorder, recurrent severe without psychotic features (HCC)    Stable, management per psychiatr.       Relevant Medications   sertraline (ZOLOFT) 25 MG tablet   Insomnia    Stable on hydroxyzine per psychiatry. Continue same.       Hypertension    Repeat BP is improved. Continue amlodipine 5mg  and hydrochlorothiazide, low sodium diet.       Encounter for contraceptive management    She has been taking the ocp continuously without the 4th week of placebo pills. Recommended that she return to the standard 3 weeks active pills and 1 week inactive pills.  Hopefully this will cut back on the breakthrough bleeding.       Controlled type 2 diabetes mellitus without complication, without long-term current use of insulin (HCC) - Primary    Lab Results  Component Value Date   HGBA1C 6.7 (H) 07/29/2023   HGBA1C 6.2 09/04/2022   HGBA1C 6.0 (H) 08/12/2017   Lab Results  Component Value Date   LDLCALC 96 09/04/2022   CREATININE 0.86 07/29/2023   This is a new  diagnosis for pt. We discussed DM diet, exercise and weight loss. Discussed GLP-1 for DM and weight loss. She declines due to needle phobia.       Relevant Orders   Urine Microalbumin w/creat. ratio   Other Visit Diagnoses     Irregular menstrual bleeding       Relevant Orders   Pregnancy, urine       I have discontinued Damyiah E. Agrawal's desvenlafaxine and sertraline. I have also changed her omeprazole. Additionally, I am having her start on sertraline. Lastly, I am having her maintain her hydrOXYzine, norethindrone-ethinyl estradiol-iron, amLODipine, and hydrochlorothiazide.  Meds ordered this encounter  Medications   sertraline (ZOLOFT) 25 MG tablet    Sig: Take 1 tablet (25 mg total) by mouth daily.    Order Specific Question:   Supervising Provider    Answer:   Danise Edge A [[id number]]   omeprazole (PRILOSEC) 40 MG capsule    Sig: Take 1 capsule (40 mg total) by mouth daily as needed.    Order Specific Question:   Supervising Provider    Answer:   Danise Edge A [[id number]]

## 2023-09-25 NOTE — Assessment & Plan Note (Signed)
She has been taking the ocp continuously without the 4th week of placebo pills. Recommended that she return to the standard 3 weeks active pills and 1 week inactive pills.  Hopefully this will cut back on the breakthrough bleeding.

## 2023-09-25 NOTE — Assessment & Plan Note (Signed)
Lab Results  Component Value Date   HGBA1C 6.7 (H) 07/29/2023   HGBA1C 6.2 09/04/2022   HGBA1C 6.0 (H) 08/12/2017   Lab Results  Component Value Date   LDLCALC 96 09/04/2022   CREATININE 0.86 07/29/2023   This is a new diagnosis for pt. We discussed DM diet, exercise and weight loss. Discussed GLP-1 for DM and weight loss. She declines due to needle phobia.

## 2023-09-25 NOTE — Assessment & Plan Note (Signed)
Stable, management per psychiatr.

## 2023-10-29 ENCOUNTER — Ambulatory Visit: Payer: Managed Care, Other (non HMO) | Admitting: Family

## 2023-11-12 ENCOUNTER — Ambulatory Visit: Payer: Managed Care, Other (non HMO) | Admitting: Family

## 2023-11-12 ENCOUNTER — Ambulatory Visit (INDEPENDENT_AMBULATORY_CARE_PROVIDER_SITE_OTHER): Payer: Managed Care, Other (non HMO) | Admitting: Family

## 2023-11-12 ENCOUNTER — Telehealth: Payer: Self-pay | Admitting: Family

## 2023-11-12 VITALS — BP 137/78 | HR 85 | Temp 99.4°F | Resp 16 | Ht 67.0 in | Wt 272.0 lb

## 2023-11-12 DIAGNOSIS — E119 Type 2 diabetes mellitus without complications: Secondary | ICD-10-CM | POA: Diagnosis not present

## 2023-11-12 DIAGNOSIS — Z7985 Long-term (current) use of injectable non-insulin antidiabetic drugs: Secondary | ICD-10-CM

## 2023-11-12 DIAGNOSIS — Z6841 Body Mass Index (BMI) 40.0 and over, adult: Secondary | ICD-10-CM

## 2023-11-12 DIAGNOSIS — I10 Essential (primary) hypertension: Secondary | ICD-10-CM | POA: Diagnosis not present

## 2023-11-12 DIAGNOSIS — K219 Gastro-esophageal reflux disease without esophagitis: Secondary | ICD-10-CM

## 2023-11-12 DIAGNOSIS — F332 Major depressive disorder, recurrent severe without psychotic features: Secondary | ICD-10-CM

## 2023-11-12 LAB — LIPID PANEL
Cholesterol: 146 mg/dL (ref 0–200)
HDL: 34.6 mg/dL — ABNORMAL LOW (ref >39.00)
LDL Cholesterol: 88 mg/dL (ref 0–99)
NonHDL: 111.84
Total CHOL/HDL Ratio: 4
Triglycerides: 120 mg/dL (ref 0.0–149.0)
VLDL: 24 mg/dL (ref 0.0–40.0)

## 2023-11-12 LAB — COMPREHENSIVE METABOLIC PANEL
ALT: 14 U/L (ref 0–35)
AST: 14 U/L (ref 0–37)
Albumin: 3.9 g/dL (ref 3.5–5.2)
Alkaline Phosphatase: 36 U/L — ABNORMAL LOW (ref 39–117)
BUN: 6 mg/dL (ref 6–23)
CO2: 25 meq/L (ref 19–32)
Calcium: 8.7 mg/dL (ref 8.4–10.5)
Chloride: 106 meq/L (ref 96–112)
Creatinine, Ser: 0.67 mg/dL (ref 0.40–1.20)
GFR: 122.41 mL/min (ref >60.00)
Glucose, Bld: 101 mg/dL — ABNORMAL HIGH (ref 70–99)
Potassium: 3.9 meq/L (ref 3.5–5.1)
Sodium: 137 meq/L (ref 135–145)
Total Bilirubin: 0.3 mg/dL (ref 0.2–1.2)
Total Protein: 7.1 g/dL (ref 6.0–8.3)

## 2023-11-12 LAB — HEMOGLOBIN A1C: Hgb A1c MFr Bld: 6.7 % — ABNORMAL HIGH (ref 4.6–6.5)

## 2023-11-12 MED ORDER — TIRZEPATIDE 2.5 MG/0.5ML ~~LOC~~ SOAJ
2.5000 mg | SUBCUTANEOUS | 0 refills | Status: DC
Start: 2023-11-12 — End: 2024-02-18

## 2023-11-12 NOTE — Patient Instructions (Signed)
VISIT SUMMARY:  During today's visit, we reviewed your ongoing health conditions and made adjustments to your treatment plan as needed. Your diabetes management is on track with your A1c at goal. We discussed your current birth control regimen, which you are tolerating well. Your mood is stable on Sertraline, and your reflux symptoms are controlled with Omeprazole as needed. We addressed the inconsistency with your blood pressure medication and discussed potential weight loss options.  YOUR PLAN:  -MENSTRUAL IRREGULARITIES: You are experiencing breakthrough bleeding on continuous oral contraceptive pills but no bleeding during the few days off. We will continue with your current contraceptive regimen as you are tolerating it well.  -TYPE 2 DIABETES MELLITUS: Your diabetes is well-managed with your last A1c at 6.7, which is within the target range. Continue with your current diabetes management plan.  -DEPRESSION: Your mood is stable with the use of Sertraline. Continue taking Sertraline as prescribed.  -GASTROESOPHAGEAL REFLUX DISEASE: Your reflux symptoms are under control with the use of Omeprazole as needed. Continue taking Omeprazole when necessary.  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 It is important to take it consistently at a time that works best for you. Setting a reminder may help.  -WEIGHT MANAGEMENT: We discussed the potential use of Mounjaro for weight loss. If approved, you will start at the lowest dose and adjust based on side effects and weight loss. We will check in 2-3 weeks after you start to assess your progress.  -GENERAL HEALTH MAINTENANCE: Your last eye exam was in June, and we will request the report from [facility]. You received your flu shot on September 25th. A foot exam was performed today to check for neuropathy, and the results were  normal.  INSTRUCTIONS:  Please follow up in 3 months. Additionally, if you start [facility] for weight loss, check in 2-3 weeks after starting to assess side effects and weight loss.

## 2023-11-12 NOTE — Telephone Encounter (Signed)
Electronic request made 

## 2023-11-12 NOTE — Assessment & Plan Note (Signed)
Stable with prn omeprazole.

## 2023-11-12 NOTE — Telephone Encounter (Signed)
Please call [facility] and request copy of DM eye exam.

## 2023-11-12 NOTE — Assessment & Plan Note (Signed)
BP looks ok.  Continue amlodipine and hydrochlorothiazide. Recommended that she place an alarm on her phone to help with compliance.

## 2023-11-12 NOTE — Assessment & Plan Note (Signed)
  Discussed potential use of Mounjaro for weight loss. -Send prescription for [employer] to [facility]. -If approved, start at lowest dose and titrate based on side effects and weight loss. -Check in 2-3 weeks after starting Mounjaro to assess side effects and weight loss.

## 2023-11-12 NOTE — Assessment & Plan Note (Signed)
Stable, management per psychiatry.

## 2023-11-12 NOTE — Assessment & Plan Note (Signed)
  Last A1c in July was 6.7, which is at goal. -will add mounjaro to see if this can help with weight loss and DM management.

## 2023-11-12 NOTE — Progress Notes (Signed)
Subjective:     Patient ID: Paula Gross, female    DOB: March 01, 2001, 22 y.o.   MRN: [id number]  Chief Complaint  Patient presents with   Hypertension    Here for follow up, patient reports not taking medications daily as prescribed.     HPI  Discussed the use of AI scribe software for clinical note transcription with the patient, who gave verbal consent to proceed.  History of Present Illness   The patient, with a history of diabetes and reflux, presents for follow-up. She reports that she has been taking her birth control continuously, with a few days off, but did not experience any breakthrough bleeding during the off days. She is tolerating the continuous regimen well and is not bothered by the lack of bleeding.  Her last A1c in July was 6.7, which is at goal. She is also on sertraline for mood management, which she reports is working well. She takes omeprazole as needed for reflux, but has not needed it recently.  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 She plans to set a reminder to take it at a consistent time each day.  She also expresses interest in weight loss injections, which she has discussed with her mother. She is aware of potential side effects such as nausea, constipation, and reflux, and is prepared to manage these with omeprazole if necessary.      Lab Results  Component Value Date   HGBA1C 6.7 (H) 07/29/2023   BP Readings from Last 3 Encounters:  11/12/23 137/78  09/25/23 139/72  07/29/23 136/71       Health Maintenance Due  Topic Date Due   OPHTHALMOLOGY EXAM  Never done   CHLAMYDIA SCREENING  06/30/2020   COVID-19 Vaccine (3 - 2023-24 season) 09/01/2023    Past Medical History:  Diagnosis Date   Anxiety    Depression     Past Surgical History:  Procedure Laterality Date   right eye sugery Right 02/2021    Family History  Problem Relation Age of  Onset   Arthritis Mother    Diabetes Mother    Hyperlipidemia Mother    Hypertension Mother    Depression Mother    Arthritis Maternal Grandmother    Cancer Maternal Grandmother        colon   Hyperlipidemia Maternal Grandmother     Social History   Socioeconomic History   Marital status: Single    Spouse name: Not on file   Number of children: Not on file   Years of education: Not on file   Highest education level: 12th grade  Occupational History   Not on file  Tobacco Use   Smoking status: Former    Types: Cigarettes    Start date: 08/01/2023   Smokeless tobacco: Former    Quit date: 2021  Vaping Use   Vaping status: Every Day  Substance and Sexual Activity   Alcohol use: No    Alcohol/week: 0.0 standard drinks of alcohol   Drug use: No   Sexual activity: Yes    Partners: Male  Other Topics Concern   Not on file  Social History Narrative   Lives with Mom   Dad ( has "rocky" relationship with her dad who lives locally)   1 half brother (does not liver with her)   Goes to [employer]   Dog- mutt   Enjoys reading,  television, drawing   Social Determinants of Health   Financial Resource Strain: Medium Risk (11/12/2023)   Overall Financial Resource Strain (CARDIA)    Difficulty of Paying Living Expenses: Somewhat hard  Food Insecurity: Food Insecurity Present (11/12/2023)   Hunger Vital Sign    Worried About Running Out of Food in the Last Year: Sometimes true    Ran Out of Food in the Last Year: Sometimes true  Transportation Needs: No Transportation Needs (11/12/2023)   PRAPARE - Administrator, Civil Service (Medical): No    Lack of Transportation (Non-Medical): No  Physical Activity: Unknown (11/12/2023)   Exercise Vital Sign    Days of Exercise per Week: Patient declined    Minutes of Exercise per Session: 70 min  Stress: Stress Concern Present (11/12/2023)   [name] of Occupational Health - Occupational Stress  Questionnaire    Feeling of Stress : Rather much  Social Connections: Unknown (11/12/2023)   Social Connection and Isolation Panel [NHANES]    Frequency of Communication with Friends and Family: Once a week    Frequency of Social Gatherings with Friends and Family: Patient declined    Attends Religious Services: Patient declined    Database administrator or Organizations: No    Attends Engineer, structural: Not on file    Marital Status: Living with partner  Intimate Partner Violence: Not on file    Outpatient Medications Prior to Visit  Medication Sig Dispense Refill   amLODipine (NORVASC) 5 MG tablet Take 1 tablet (5 mg total) by mouth daily. 90 tablet 1   hydrochlorothiazide (HYDRODIURIL) 25 MG tablet Take 1 tablet (25 mg total) by mouth daily. 90 tablet 3   hydrOXYzine (ATARAX) 25 MG tablet Take 25-50 mg by mouth at bedtime as needed.     norethindrone-ethinyl estradiol-iron (AUROVELA FE 1.5/30) 1.5-30 MG-MCG tablet Take 1 tablet by mouth daily. 84 tablet 4   omeprazole (PRILOSEC) 40 MG capsule Take 1 capsule (40 mg total) by mouth daily as needed.     sertraline (ZOLOFT) 25 MG tablet Take 1 tablet (25 mg total) by mouth daily.     No facility-administered medications prior to visit.    No Known Allergies  ROS    See HPI Objective:    Physical Exam Constitutional:      General: She is not in acute distress.    Appearance: Normal appearance. She is well-developed.  HENT:     Head: Normocephalic and atraumatic.     Right Ear: External ear normal.     Left Ear: External ear normal.  Eyes:     General: No scleral icterus. Neck:     Thyroid: No thyromegaly.  Cardiovascular:     Rate and Rhythm: Normal rate and regular rhythm.     Heart sounds: Normal heart sounds. No murmur heard. Pulmonary:     Effort: Pulmonary effort is normal. No respiratory distress.     Breath sounds: Normal breath sounds. No wheezing.  Musculoskeletal:     Cervical back: Neck supple.   Skin:    General: Skin is warm and dry.  Neurological:     Mental Status: She is alert and oriented to person, place, and time.  Psychiatric:        Mood and Affect: Mood normal.        Behavior: Behavior normal.        Thought Content: Thought content normal.        Judgment: Judgment normal.  BP 137/78 (BP Location: Right Arm, Patient Position: Sitting, Cuff Size: Large)   Pulse 85   Temp 99.4 F (37.4 C) (Oral)   Resp 16   Ht 5\' 7"  (1.702 m)   Wt 272 lb (123.4 kg)   SpO2 100%   BMI 42.60 kg/m  Wt Readings from Last 3 Encounters:  11/12/23 272 lb (123.4 kg)  09/25/23 275 lb (124.7 kg)  07/22/23 272 lb (123.4 kg)       Assessment & Plan:   Problem List Items Addressed This Visit       Unprioritized   Morbid obesity with BMI of 40.0-44.9, adult (HCC)   Relevant Medications   tirzepatide (MOUNJARO) 2.5 MG/0.5ML Pen   Major depressive disorder, recurrent severe without psychotic features (HCC)    Stable, management per psychiatry.       Hypertension    BP looks ok.  Continue amlodipine and hydrochlorothiazide. Recommended that she place an alarm on her phone to help with compliance.       GERD (gastroesophageal reflux disease)    Stable with prn omeprazole.       Controlled type 2 diabetes mellitus without complication, without long-term current use of insulin (HCC) - Primary     Last A1c in July was 6.7, which is at goal. -will add mounjaro to see if this can help with weight loss and DM management.       Relevant Medications   tirzepatide (MOUNJARO) 2.5 MG/0.5ML Pen   Other Relevant Orders   HgB A1c   Lipid panel   Comp Met (CMET)    I am having Paula Gross start on tirzepatide. I am also having her maintain her hydrOXYzine, norethindrone-ethinyl estradiol-iron, amLODipine, hydrochlorothiazide, sertraline, and omeprazole.  Meds ordered this encounter  Medications   tirzepatide (MOUNJARO) 2.5 MG/0.5ML Pen    Sig: Inject 2.5 mg into the  skin once a week.    Dispense:  2 mL    Refill:  0    Order Specific Question:   Supervising Provider    Answer:   Danise Edge A [[id number]]

## 2023-11-13 NOTE — Telephone Encounter (Signed)
Error

## 2023-11-25 ENCOUNTER — Encounter: Payer: Self-pay | Admitting: Family

## 2023-11-26 ENCOUNTER — Other Ambulatory Visit (HOSPITAL_COMMUNITY): Payer: Self-pay

## 2023-12-04 ENCOUNTER — Other Ambulatory Visit (HOSPITAL_COMMUNITY): Payer: Self-pay

## 2023-12-04 NOTE — Telephone Encounter (Signed)
Patient reports she does not have another insurance coverage. She will contact [employer].

## 2023-12-04 NOTE — Telephone Encounter (Signed)
Patients [employer] is showing as a secondary plan- we will need the patients primary Pharmacy Benefits information or the patient will have to call the [employer] plan to make it the primary plan if they do not have other coverage.

## 2023-12-04 NOTE — Telephone Encounter (Signed)
Pt called back and stated that she is using [place]. She asked for [facility] to be sent via [employer] delivery. Please advise.

## 2023-12-25 ENCOUNTER — Other Ambulatory Visit: Payer: Self-pay | Admitting: Family

## 2023-12-30 ENCOUNTER — Other Ambulatory Visit (HOSPITAL_COMMUNITY): Payer: Self-pay

## 2024-01-03 ENCOUNTER — Other Ambulatory Visit (HOSPITAL_COMMUNITY): Payer: Self-pay

## 2024-01-07 ENCOUNTER — Other Ambulatory Visit (HOSPITAL_COMMUNITY): Payer: Self-pay

## 2024-01-21 ENCOUNTER — Other Ambulatory Visit (HOSPITAL_COMMUNITY): Payer: Self-pay

## 2024-02-12 ENCOUNTER — Encounter: Payer: Self-pay | Admitting: Family

## 2024-02-12 ENCOUNTER — Ambulatory Visit: Payer: Managed Care, Other (non HMO) | Admitting: Family

## 2024-02-12 VITALS — BP 130/77 | HR 94 | Temp 99.0°F | Resp 16 | Ht 67.0 in | Wt 269.0 lb

## 2024-02-12 DIAGNOSIS — G4700 Insomnia, unspecified: Secondary | ICD-10-CM

## 2024-02-12 DIAGNOSIS — F332 Major depressive disorder, recurrent severe without psychotic features: Secondary | ICD-10-CM

## 2024-02-12 DIAGNOSIS — I10 Essential (primary) hypertension: Secondary | ICD-10-CM

## 2024-02-12 DIAGNOSIS — E119 Type 2 diabetes mellitus without complications: Secondary | ICD-10-CM | POA: Diagnosis not present

## 2024-02-12 DIAGNOSIS — Z7984 Long term (current) use of oral hypoglycemic drugs: Secondary | ICD-10-CM

## 2024-02-12 DIAGNOSIS — K219 Gastro-esophageal reflux disease without esophagitis: Secondary | ICD-10-CM

## 2024-02-12 LAB — BASIC METABOLIC PANEL
BUN: 13 mg/dL (ref 6–23)
CO2: 28 meq/L (ref 19–32)
Calcium: 8.9 mg/dL (ref 8.4–10.5)
Chloride: 101 meq/L (ref 96–112)
Creatinine, Ser: 0.69 mg/dL (ref 0.40–1.20)
GFR: 121.33 mL/min (ref >60.00)
Glucose, Bld: 151 mg/dL — ABNORMAL HIGH (ref 70–99)
Potassium: 3.8 meq/L (ref 3.5–5.1)
Sodium: 137 meq/L (ref 135–145)

## 2024-02-12 LAB — HEMOGLOBIN A1C: Hgb A1c MFr Bld: 7.6 % — ABNORMAL HIGH (ref 4.6–6.5)

## 2024-02-12 NOTE — Patient Instructions (Signed)
VISIT SUMMARY:  Paula Gross, a 23 year old female, visited today due to worsening depression and anxiety symptoms following a recent breakup. She is currently on sertraline for her mental health and has been using hydroxyzine for sleep disturbances. She also manages type 2 diabetes, hypertension, and occasional heartburn. Her blood pressure is well-controlled, and she is awaiting insurance approval for tirzepatide to help manage her diabetes and weight. She has been experiencing nausea and has been taking omeprazole as needed.  YOUR PLAN:  -DEPRESSION AND ANXIETY: Depression and anxiety are mental health conditions that can cause persistent feelings of sadness, worry, and restlessness. Your symptoms have worsened due to a recent breakup. Continue taking sertraline as prescribed and seek counseling if it becomes financially feasible.  -INSOMNIA: Insomnia is difficulty falling or staying asleep. You have been experiencing restless sleep and using hydroxyzine as needed. Continue using hydroxyzine as needed to help with sleep.  -TYPE 2 DIABETES MELLITUS: Type 2 diabetes is a condition where the body does not use insulin properly, leading to high blood sugar levels. Your last A1c was 6.7%. We will check your A1c today, and if tirzepatide is approved by your insurance, you can start this medication to help lower your A1c and assist with weight loss.  -HYPERTENSION: Hypertension is high blood pressure. Your blood pressure is well-controlled with your current medications, amlodipine 5mg  and hydrochlorothiazide 25mg . Continue taking these medications as prescribed.  -GASTROESOPHAGEAL REFLUX DISEASE (GERD): GERD is a condition where stomach acid frequently flows back into the tube connecting your mouth and stomach, causing discomfort. You have been experiencing occasional nausea and are taking omeprazole as needed. Continue taking omeprazole as needed.  -GENERAL HEALTH MAINTENANCE: We will check your  kidney function today and schedule a physical in 3 months. Please continue to maintain a healthy diet and exercise regularly.  INSTRUCTIONS:  Please follow up with the lab to check your A1c and kidney function today. Schedule a physical exam in 3 months. Continue your current medications and lifestyle recommendations. Seek counseling if it becomes financially feasible.

## 2024-02-12 NOTE — Assessment & Plan Note (Signed)
Stable with prn omeprazole.  Continue same.

## 2024-02-12 NOTE — Progress Notes (Signed)
Subjective:     Patient ID: Paula Gross, female    DOB: Jun 23, 2001, 23 y.o.   MRN: [id number]  Chief Complaint  Patient presents with   Hypertension    Here for follow up   Diabetes    Here for follow up    Hypertension  Diabetes    Discussed the use of AI scribe software for clinical note transcription with the patient, who gave verbal consent to proceed.  History of Present Illness   Paula Gross is a 23 year old female with depression and anxiety who presents for routine follow up.    She is experiencing exacerbation of her depression and anxiety symptoms following a recent breakup. She describes feeling as though she is 'going through the motions' and notes that while it is not as severe as previous breakups, it remains challenging. She is currently taking sertraline for her anxiety and depression but has not been able to continue counseling due to work commitments. She is looking to resume counseling when possible.  She reports sleep disturbances and feeling more restless than usual, sometimes taking hydroxyzine to aid sleep. She is also experiencing occasional nausea, particularly before or after eating, but denies recent heartburn issues. She takes omeprazole as needed for heartburn.  Her blood pressure is well-controlled with amlodipine 5 mg and hydrochlorothiazide 25 mg. She is managing her diabetes with a recent A1c of 6.7% and is awaiting insurance approval for tirzepatide Greggory Keen) to help manage her blood sugar and weight. Her diet has been affected by her emotional state, leading to comfort eating, but she attempts to choose healthier snacks.          Health Maintenance Due  Topic Date Due   Pneumococcal Vaccine 35-75 Years old (1 of 2 - PCV) Never done   CHLAMYDIA SCREENING  06/30/2020   COVID-19 Vaccine (3 - 2024-25 season) 09/01/2023    Past Medical History:  Diagnosis Date   Anxiety    Depression     Past Surgical History:  Procedure  Laterality Date   right eye sugery Right 02/2021    Family History  Problem Relation Age of Onset   Arthritis Mother    Diabetes Mother    Hyperlipidemia Mother    Hypertension Mother    Depression Mother    Arthritis Maternal Grandmother    Cancer Maternal Grandmother        colon   Hyperlipidemia Maternal Grandmother     Social History   Socioeconomic History   Marital status: Single    Spouse name: Not on file   Number of children: Not on file   Years of education: Not on file   Highest education level: 12th grade  Occupational History   Not on file  Tobacco Use   Smoking status: Former    Types: Cigarettes    Start date: 08/01/2023   Smokeless tobacco: Former    Quit date: 2021  Vaping Use   Vaping status: Every Day  Substance and Sexual Activity   Alcohol use: No    Alcohol/week: 0.0 standard drinks of alcohol   Drug use: No   Sexual activity: Yes    Partners: Male  Other Topics Concern   Not on file  Social History Narrative   Lives with Mom   Dad ( has "rocky" relationship with her dad who lives locally)   1 half brother (does not liver with her)   Goes to [employer]   Dog- [employer]  Enjoys reading, television, drawing   Social Drivers of Health   Financial Resource Strain: Medium Risk (02/12/2024)   Overall Financial Resource Strain (CARDIA)    Difficulty of Paying Living Expenses: Somewhat hard  Food Insecurity: Food Insecurity Present (02/12/2024)   Hunger Vital Sign    Worried About Running Out of Food in the Last Year: Often true    Ran Out of Food in the Last Year: Sometimes true  Transportation Needs: No Transportation Needs (02/12/2024)   PRAPARE - Administrator, Civil Service (Medical): No    Lack of Transportation (Non-Medical): No  Physical Activity: Inactive (02/12/2024)   Exercise Vital Sign    Days of Exercise per Week: 0 days    Minutes of Exercise per Session: 70 min  Stress: Stress Concern Present  (02/12/2024)   [name] of Occupational Health - Occupational Stress Questionnaire    Feeling of Stress : Very much  Social Connections: Unknown (02/12/2024)   Social Connection and Isolation Panel [NHANES]    Frequency of Communication with Friends and Family: Twice a week    Frequency of Social Gatherings with Friends and Family: Twice a week    Attends Religious Services: Never    Database administrator or Organizations: No    Attends Engineer, structural: Not on file    Marital Status: Patient declined  Catering manager Violence: Not on file    Outpatient Medications Prior to Visit  Medication Sig Dispense Refill   amLODipine (NORVASC) 5 MG tablet Take 1 tablet (5 mg total) by mouth daily. 90 tablet 1   hydrochlorothiazide (HYDRODIURIL) 25 MG tablet Take 1 tablet (25 mg total) by mouth daily. 90 tablet 3   hydrOXYzine (ATARAX) 25 MG tablet Take 25-50 mg by mouth at bedtime as needed.     JUNEL FE 1.5/30 1.5-30 MG-MCG tablet TAKE 1 TABLET BY MOUTH DAILY 84 tablet 4   omeprazole (PRILOSEC) 40 MG capsule Take 1 capsule (40 mg total) by mouth daily as needed.     sertraline (ZOLOFT) 25 MG tablet Take 1 tablet (25 mg total) by mouth daily.     tirzepatide [facility]) 2.5 MG/0.5ML Pen Inject 2.5 mg into the skin once a week. 2 mL 0   No facility-administered medications prior to visit.    No Known Allergies  ROS    See HPI Objective:    Physical Exam Constitutional:      General: She is not in acute distress.    Appearance: Normal appearance. She is well-developed.  HENT:     Head: Normocephalic and atraumatic.     Right Ear: External ear normal.     Left Ear: External ear normal.  Eyes:     General: No scleral icterus. Neck:     Thyroid: No thyromegaly.  Cardiovascular:     Rate and Rhythm: Normal rate and regular rhythm.     Heart sounds: Normal heart sounds. No murmur heard. Pulmonary:     Effort: Pulmonary effort is normal. No respiratory distress.      Breath sounds: Normal breath sounds. No wheezing.  Musculoskeletal:     Cervical back: Neck supple.  Skin:    General: Skin is warm and dry.  Neurological:     Mental Status: She is alert and oriented to person, place, and time.  Psychiatric:        Mood and Affect: Mood normal.        Behavior: Behavior normal.  Thought Content: Thought content normal.        Judgment: Judgment normal.      BP 130/77 (BP Location: Right Arm, Patient Position: Sitting, Cuff Size: Large)   Pulse 94   Temp 99 F (37.2 C) (Oral)   Resp 16   Ht 5\' 7"  (1.702 m)   Wt 269 lb (122 kg)   SpO2 100%   BMI 42.13 kg/m  Wt Readings from Last 3 Encounters:  02/12/24 269 lb (122 kg)  11/12/23 272 lb (123.4 kg)  09/25/23 275 lb (124.7 kg)       Assessment & Plan:   Problem List Items Addressed This Visit       Unprioritized   Major depressive disorder, recurrent severe without psychotic features (HCC) - Primary   Following with psychiatry, Leone Payor- increased depression and anxiety due to a recent breakup. Continue sertraline. Encouraged her to seek counseling when she is able.       Insomnia   Sleeping is overall stable.  Has hydroxyzine for prn use.       Hypertension   BP at goal, continue amlodipine and hydrochlorothiazide.        GERD (gastroesophageal reflux disease)   Stable with prn omeprazole.  Continue same.       Controlled type 2 diabetes mellitus without complication, without long-term current use of insulin (HCC)   Lab Results  Component Value Date   HGBA1C 6.7 (H) 11/12/2023   HGBA1C 6.7 (H) 07/29/2023   HGBA1C 6.2 09/04/2022   Lab Results  Component Value Date   MICROALBUR <0.7 09/25/2023   LDLCALC 88 11/12/2023   CREATININE 0.67 11/12/2023   Working on trying to get [employer].  Diet needs improvement- has been stress eating.       Relevant Orders   HgB A1c   Basic Metabolic Panel (BMET)    I am having Lorelle E. Gallina maintain her  hydrOXYzine, amLODipine, hydrochlorothiazide, sertraline, omeprazole, tirzepatide, and Junel FE 1.5/30.  No orders of the defined types were placed in this encounter.

## 2024-02-12 NOTE — Assessment & Plan Note (Addendum)
Following with psychiatry, Paula Gross- increased depression and anxiety due to a recent breakup. Continue sertraline. Encouraged her to seek counseling when she is able.

## 2024-02-12 NOTE — Assessment & Plan Note (Signed)
Sleeping is overall stable.  Has hydroxyzine for prn use.

## 2024-02-12 NOTE — Assessment & Plan Note (Signed)
Lab Results  Component Value Date   HGBA1C 6.7 (H) 11/12/2023   HGBA1C 6.7 (H) 07/29/2023   HGBA1C 6.2 09/04/2022   Lab Results  Component Value Date   MICROALBUR <0.7 09/25/2023   LDLCALC 88 11/12/2023   CREATININE 0.67 11/12/2023   Working on trying to get [employer].  Diet needs improvement- has been stress eating.

## 2024-02-12 NOTE — Assessment & Plan Note (Signed)
BP at goal, continue amlodipine and hydrochlorothiazide.

## 2024-02-14 ENCOUNTER — Telehealth: Payer: Self-pay | Admitting: Family

## 2024-02-14 DIAGNOSIS — E119 Type 2 diabetes mellitus without complications: Secondary | ICD-10-CM

## 2024-02-14 NOTE — Telephone Encounter (Signed)
Called patient but no answer, left voice mail for patient to call back.

## 2024-02-14 NOTE — Telephone Encounter (Signed)
Please advise pt that her A1C is above goal at 7.6.  I would like her to let me know if she is unable to get the zepbound filled.

## 2024-02-18 MED ORDER — TIRZEPATIDE 2.5 MG/0.5ML ~~LOC~~ SOAJ: 2.5000 mg | SUBCUTANEOUS | 0 refills | Status: DC

## 2024-02-18 NOTE — Addendum Note (Signed)
 Addended by: Sandford Craze on: 02/18/2024 01:01 PM   Modules accepted: Orders

## 2024-02-18 NOTE — Telephone Encounter (Signed)
Patient made aware of this information.

## 2024-03-23 ENCOUNTER — Other Ambulatory Visit: Payer: Self-pay | Admitting: Family

## 2024-03-23 DIAGNOSIS — I10 Essential (primary) hypertension: Secondary | ICD-10-CM

## 2024-03-24 ENCOUNTER — Telehealth: Payer: Self-pay | Admitting: Pharmacist

## 2024-03-24 ENCOUNTER — Encounter: Payer: Self-pay | Admitting: Family

## 2024-03-24 NOTE — Telephone Encounter (Signed)
 Pharmacy Patient Advocate Encounter   Received notification from Patient Pharmacy that prior authorization for [facility] 2.5MG /0.5ML auto-injectors is required/requested.   Insurance verification completed.   The patient is insured through [facility] .   Per test claim: PA required; PA started via CoverMyMeds. KEY [id number] . Waiting for clinical questions to populate.

## 2024-03-29 ENCOUNTER — Other Ambulatory Visit: Payer: Self-pay | Admitting: Family

## 2024-03-30 ENCOUNTER — Other Ambulatory Visit: Payer: Self-pay | Admitting: Family

## 2024-03-30 ENCOUNTER — Other Ambulatory Visit (HOSPITAL_COMMUNITY): Payer: Self-pay

## 2024-03-30 MED ORDER — OMEPRAZOLE 40 MG PO CPDR: 40.0000 mg | DELAYED_RELEASE_CAPSULE | Freq: Every day | ORAL | 0 refills | Status: DC | PRN

## 2024-04-08 ENCOUNTER — Other Ambulatory Visit (HOSPITAL_COMMUNITY): Payer: Self-pay

## 2024-04-08 NOTE — Telephone Encounter (Signed)
 It looks like the patient has [employer]; however, they are saying this must be submitted to her primary insurance.  I do not see that she has another insurance.  Patient will need to contact  [employer] if she has no other insurance before we can get the prior authorization approved.

## 2024-04-16 NOTE — Telephone Encounter (Signed)
 Pt will call her ins to let them know that she does not have another coverage.  She will call back.

## 2024-04-28 ENCOUNTER — Other Ambulatory Visit (HOSPITAL_COMMUNITY): Payer: Self-pay

## 2024-05-12 ENCOUNTER — Ambulatory Visit: Payer: Self-pay | Admitting: Family

## 2024-05-12 ENCOUNTER — Ambulatory Visit (INDEPENDENT_AMBULATORY_CARE_PROVIDER_SITE_OTHER): Payer: Managed Care, Other (non HMO) | Admitting: Family

## 2024-05-12 ENCOUNTER — Encounter: Payer: Self-pay | Admitting: Family

## 2024-05-12 ENCOUNTER — Other Ambulatory Visit (HOSPITAL_BASED_OUTPATIENT_CLINIC_OR_DEPARTMENT_OTHER): Payer: Self-pay

## 2024-05-12 VITALS — BP 138/73 | HR 87 | Temp 98.0°F | Resp 16 | Ht 67.0 in | Wt 276.0 lb

## 2024-05-12 DIAGNOSIS — Z0000 Encounter for general adult medical examination without abnormal findings: Secondary | ICD-10-CM

## 2024-05-12 DIAGNOSIS — Z23 Encounter for immunization: Secondary | ICD-10-CM | POA: Diagnosis not present

## 2024-05-12 DIAGNOSIS — E119 Type 2 diabetes mellitus without complications: Secondary | ICD-10-CM

## 2024-05-12 DIAGNOSIS — Z0001 Encounter for general adult medical examination with abnormal findings: Secondary | ICD-10-CM | POA: Diagnosis not present

## 2024-05-12 DIAGNOSIS — R300 Dysuria: Secondary | ICD-10-CM | POA: Diagnosis not present

## 2024-05-12 DIAGNOSIS — Z7985 Long-term (current) use of injectable non-insulin antidiabetic drugs: Secondary | ICD-10-CM | POA: Diagnosis not present

## 2024-05-12 LAB — BASIC METABOLIC PANEL WITH GFR
BUN: 12 mg/dL (ref 6–23)
CO2: 25 meq/L (ref 19–32)
Calcium: 9.1 mg/dL (ref 8.4–10.5)
Chloride: 105 meq/L (ref 96–112)
Creatinine, Ser: 0.7 mg/dL (ref 0.40–1.20)
GFR: 120.7 mL/min (ref >60.00)
Glucose, Bld: 127 mg/dL — ABNORMAL HIGH (ref 70–99)
Potassium: 4.3 meq/L (ref 3.5–5.1)
Sodium: 136 meq/L (ref 135–145)

## 2024-05-12 LAB — HEMOGLOBIN A1C: Hgb A1c MFr Bld: 8.2 % — ABNORMAL HIGH (ref 4.6–6.5)

## 2024-05-12 MED ORDER — TIRZEPATIDE 2.5 MG/0.5ML ~~LOC~~ SOAJ
2.5000 mg | SUBCUTANEOUS | 0 refills | Status: DC
Start: 2024-05-12 — End: 2024-08-12
  Filled 2024-05-12: qty 6, 84d supply, fill #0
  Filled 2024-05-26: qty 2, 28d supply, fill #0
  Filled 2024-06-26 (×2): qty 2, 28d supply, fill #1

## 2024-05-12 NOTE — Progress Notes (Signed)
 Subjective:     Patient ID: Paula Gross, female    DOB: Mar 27, 2001, 23 y.o.   MRN: [id number]  Chief Complaint  Patient presents with   Annual Exam    HPI  Discussed the use of AI scribe software for clinical note transcription with the patient, who gave verbal consent to proceed.  History of Present Illness Paula Gross is a 23 year old female who presents for an annual physical exam.  She is on Zoloft  25 mg and follows up with psychiatry, noting slight improvement in anxiety and depression. She is aware of a potential interaction between her birth control and Mounjaro. Immunizations are current, including tetanus and HPV. Her last Pap smear was in July 2022, with plans to schedule the next one. She intends to schedule an eye exam soon and is uncertain about her last dental visit, likely two years ago.  She recently moved back to [place], living with her mother, which affects her diet. She is managing portion sizes and reducing intake of certain foods. She plans to resume regular exercise after resolving her [employer] and may use her mother's weights at home. She uses marijuana almost daily, smokes three to four cigarettes per day, and drinks alcohol occasionally.  She experiences a lingering cough, possibly due to allergies or smoking, and urinates frequently without dysuria. She has no concerns about hearing or vision, no leg swelling, constipation, diarrhea, unusual muscle or joint pain, or frequent headaches. Immunizations: up to date Diet: fair- working on getting her own place. Wt Readings from Last 3 Encounters:  05/12/24 276 lb (125.2 kg)  02/12/24 269 lb (122 kg)  11/12/23 272 lb (123.4 kg)  Exercise: not regular.  Trying to pay off gym bill.  Pap Smear: due sees GYN Vision: due will schedule Dental: due      Health Maintenance Due  Topic Date Due   CHLAMYDIA SCREENING  06/30/2020   COVID-19 Vaccine (3 - 2024-25 season) 09/01/2023    Past  Medical History:  Diagnosis Date   Anxiety    Depression     Past Surgical History:  Procedure Laterality Date   right eye sugery Right 02/2021    Family History  Problem Relation Age of Onset   Arthritis Mother    Diabetes Mother    Hyperlipidemia Mother    Hypertension Mother    Depression Mother    Arthritis Maternal Grandmother    Cancer Maternal Grandmother        colon   Hyperlipidemia Maternal Grandmother    Breast cancer Maternal Aunt     Social History   Socioeconomic History   Marital status: Single    Spouse name: Not on file   Number of children: Not on file   Years of education: Not on file   Highest education level: 12th grade  Occupational History   Not on file  Tobacco Use   Smoking status: Every Day    Types: Cigarettes    Start date: 08/01/2023   Smokeless tobacco: Former    Quit date: 2021  Vaping Use   Vaping status: Every Day  Substance and Sexual Activity   Alcohol use: Yes    Comment: occasional   Drug use: Yes    Comment: daily marijuana   Sexual activity: Yes    Partners: Male  Other Topics Concern   Not on file  Social History Narrative   Lives with Mom   Dad ( has "rocky" relationship with her dad  who lives locally)   1 half brother (does not liver with her)   Goes to [employer]   Dog- [employer]   Enjoys reading, television, drawing   Social Drivers of Health   Financial Resource Strain: Medium Risk (02/12/2024)   Overall Financial Resource Strain (CARDIA)    Difficulty of Paying Living Expenses: Somewhat hard  Food Insecurity: Food Insecurity Present (02/12/2024)   Hunger Vital Sign    Worried About Running Out of Food in the Last Year: Often true    Ran Out of Food in the Last Year: Sometimes true  Transportation Needs: No Transportation Needs (02/12/2024)   PRAPARE - Administrator, Civil Service (Medical): No    Lack of Transportation (Non-Medical): No  Physical Activity: Unknown (02/12/2024)    Exercise Vital Sign    Days of Exercise per Week: 0 days    Minutes of Exercise per Session: Not on file  Recent Concern: Physical Activity - Inactive (02/12/2024)   Exercise Vital Sign    Days of Exercise per Week: 0 days    Minutes of Exercise per Session: 70 min  Stress: Stress Concern Present (02/12/2024)   [name] of Occupational Health - Occupational Stress Questionnaire    Feeling of Stress : Very much  Social Connections: Unknown (02/12/2024)   Social Connection and Isolation Panel [NHANES]    Frequency of Communication with Friends and Family: Twice a week    Frequency of Social Gatherings with Friends and Family: Twice a week    Attends Religious Services: Never    Database administrator or Organizations: No    Attends Engineer, structural: Not on file    Marital Status: Patient declined  Catering manager Violence: Not on file    Outpatient Medications Prior to Visit  Medication Sig Dispense Refill   amLODipine  (NORVASC ) 5 MG tablet TAKE 1 TABLET(5 MG) BY MOUTH DAILY 90 tablet 1   hydrochlorothiazide  (HYDRODIURIL ) 25 MG tablet Take 1 tablet (25 mg total) by mouth daily. 90 tablet 3   hydrOXYzine  (ATARAX ) 25 MG tablet Take 25-50 mg by mouth at bedtime as needed.     JUNEL FE 1.5/30 1.5-30 MG-MCG tablet TAKE 1 TABLET BY MOUTH DAILY 84 tablet 4   omeprazole  (PRILOSEC) 40 MG capsule Take 1 capsule (40 mg total) by mouth daily as needed. 90 capsule 0   sertraline  (ZOLOFT ) 25 MG tablet Take 1 tablet (25 mg total) by mouth daily.     tirzepatide (MOUNJARO) 2.5 MG/0.5ML Pen Inject 2.5 mg into the skin once a week. (Patient not taking: Reported on 05/12/2024) 6 mL 0   No facility-administered medications prior to visit.    No Known Allergies  Review of Systems  HENT:  Positive for congestion. Negative for hearing loss.   Eyes:  Negative for blurred vision.  Respiratory:  Positive for cough (mild cough- attributes to allergies).   Cardiovascular:  Negative for  leg swelling.  Gastrointestinal:  Negative for constipation and diarrhea.  Genitourinary:  Negative for dysuria and frequency.  Musculoskeletal:  Negative for joint pain and myalgias.  Skin:  Negative for rash.  Neurological:  Negative for headaches.  Psychiatric/Behavioral:         Overall mood is better than last visit       Objective:     Physical Exam   BP 138/73 (BP Location: Right Arm, Patient Position: Sitting, Cuff Size: Large)   Pulse 87   Temp 98 F (36.7 C) (Oral)  Resp 16   Ht 5\' 7"  (1.702 m)   Wt 276 lb (125.2 kg)   SpO2 100%   BMI 43.23 kg/m  Wt Readings from Last 3 Encounters:  05/12/24 276 lb (125.2 kg)  02/12/24 269 lb (122 kg)  11/12/23 272 lb (123.4 kg)   Physical Exam  Constitutional: She is oriented to person, place, and time. She appears well-developed and well-nourished. No distress.  HENT:  Head: Normocephalic and atraumatic.  Right Ear: Tympanic membrane and ear canal normal.  Left Ear: Tympanic membrane and ear canal normal.  Mouth/Throat: Oropharynx is clear and moist.  Eyes: Pupils are equal, round, and reactive to light. No scleral icterus.  Neck: Normal range of motion. No thyromegaly present.  Cardiovascular: Normal rate and regular rhythm.   No murmur heard. Pulmonary/Chest: Effort normal and breath sounds normal. No respiratory distress. He has no wheezes. She has no rales. She exhibits no tenderness.  Abdominal: Soft. Bowel sounds are normal. She exhibits no distension and no mass. There is no tenderness. There is no rebound and no guarding.  Musculoskeletal: She exhibits no edema.  Lymphadenopathy:    She has no cervical adenopathy.  Neurological: She is alert and oriented to person, place, and time. She has normal patellar reflexes. She exhibits normal muscle tone. Coordination normal.  Skin: Skin is warm and dry.  Psychiatric: She has a normal mood and affect. Her behavior is normal. Judgment and thought content normal.   Breast/pelvic: deferred  Assessment & Plan:       Assessment & Plan:   Problem List Items Addressed This Visit       Unprioritized   Preventative health care    Tetanus and HPV immunizations up to date. Influenza and COVID booster recommended this fall. Prevnar 20 today. Pap smear due. Needs ophthalmologic and dental exams. - Administer influenza vaccination and COVID booster in the fall. - Schedule Pap smear/chlamydia screen with gynecology. - Schedule ophthalmologic and dental exams.      Dysuria - Primary   Rule out UTI.        Relevant Orders   Urine Culture   Controlled type 2 diabetes mellitus without complication, without long-term current use of insulin (HCC)   She had insurance issues at the pharmacy- will resend rx for mounjaro downstairs to be filed through [place].       Relevant Medications   tirzepatide (MOUNJARO) 2.5 MG/0.5ML Pen   Other Relevant Orders   Basic Metabolic Panel (BMET)   HgB A1c   Other Visit Diagnoses       Need for pneumococcal 20-valent conjugate vaccination       Relevant Orders   Pneumococcal conjugate vaccine 20-valent (Prevnar 20) (Completed)       I am having Keyunna E. Gaetz maintain her hydrOXYzine , hydrochlorothiazide , sertraline , Junel FE 1.5/30, amLODipine , omeprazole , and tirzepatide.  Meds ordered this encounter  Medications   tirzepatide (MOUNJARO) 2.5 MG/0.5ML Pen    Sig: Inject 2.5 mg into the skin once a week.    Dispense:  6 mL    Refill:  0    Patient no longer has medicaid- please file through [place].    Supervising Provider:   Randie Bustle A [phone]

## 2024-05-12 NOTE — Assessment & Plan Note (Addendum)
  Tetanus and HPV immunizations up to date. Influenza and COVID booster recommended this fall. Prevnar 20 today. Pap smear due. Needs ophthalmologic and dental exams. - Administer influenza vaccination and COVID booster in the fall. - Schedule Pap smear/chlamydia screen with gynecology. - Schedule ophthalmologic and dental exams.

## 2024-05-12 NOTE — Assessment & Plan Note (Signed)
Rule out UTI.  °

## 2024-05-12 NOTE — Patient Instructions (Signed)
 VISIT SUMMARY:  Today, you had your annual physical exam. We discussed your current medications, lifestyle habits, and some health concerns including frequent urination, depression, anxiety, tobacco use, marijuana use, and a lingering cough. We also reviewed your general health maintenance needs.  YOUR PLAN:  URINARY SYMPTOMS: You have been experiencing frequent urination without pain, which could be due to high blood sugar or an infection. -We will order a urine culture to check for any infection.  DEPRESSION: You are currently taking sertraline  (Zoloft ) 25 mg and have noticed some improvement in your symptoms, although you are still experiencing some depression. -Continue taking sertraline  as prescribed. -Consider re-engaging in therapy when your life circumstances allow.  ANXIETY: You are experiencing anxiety and partially self-medicating with cannabis. You have noticed some improvement with recent medication adjustments by your psychiatrist. -Continue following up with your psychiatrist for medication management. -Consider reducing cannabis use as it may affect your anxiety and overall health.  TOBACCO USE DISORDER: You are smoking 3-4 cigarettes per day and have previously quit smoking but have resumed. -Consider strategies to reduce or quit smoking again, such as nicotine replacement therapy or counseling.  MARIJUANA USE: You use cannabis almost daily, mainly for habit and anxiety self-medication. -Consider reducing cannabis use as it may impact your anxiety and overall health.  ALLERGIC RHINITIS: You have a lingering cough, likely due to allergies or smoking, without any cold symptoms. -Consider using over-the-counter allergy medications to manage your symptoms. -Reducing smoking may also help alleviate your cough.  GENERAL HEALTH MAINTENANCE: Your immunizations are up to date, but you are due for a Pap smear and need to schedule eye and dental exams. -Administer influenza  vaccination and COVID booster in the fall. -Schedule a Pap smear with gynecology. -Schedule ophthalmologic and dental exams.

## 2024-05-12 NOTE — Assessment & Plan Note (Signed)
 She had insurance issues at the pharmacy- will resend rx for mounjaro downstairs to be filed through [place].

## 2024-05-13 LAB — URINE CULTURE
MICRO NUMBER:: 16448695
SPECIMEN QUALITY:: ADEQUATE

## 2024-05-18 ENCOUNTER — Ambulatory Visit
Admission: RE | Admit: 2024-05-18 | Discharge: 2024-05-18 | Disposition: A | Discharge status: Home / Self Care | Source: Ambulatory Visit | Attending: Family Medicine | Admitting: Family Medicine

## 2024-05-18 VITALS — BP 134/81 | HR 98 | Temp 98.9°F | Resp 16

## 2024-05-18 DIAGNOSIS — K529 Noninfective gastroenteritis and colitis, unspecified: Secondary | ICD-10-CM

## 2024-05-18 MED ORDER — LOPERAMIDE HCL 2 MG PO CAPS: 2.0000 mg | ORAL_CAPSULE | Freq: Two times a day (BID) | ORAL | 0 refills | Status: DC | PRN

## 2024-05-18 MED ORDER — ONDANSETRON 8 MG PO TBDP: 8.0000 mg | ORAL_TABLET | Freq: Three times a day (TID) | ORAL | 0 refills | Status: DC | PRN

## 2024-05-18 NOTE — ED Provider Notes (Signed)
 [facility]  Note:  This document was prepared using Conservation officer, historic buildings and may include unintentional dictation errors.  MRN: [id number] DOB: 09-07-2001  Subjective:   Paula Gross is a 23 y.o. female presenting for 2 day history of abdominal cramping, diarrhea, nausea without vomiting. She did eat sushi about 1 week ago. No bloody stools. No fever, recent antibiotic use, hospitalizations or long distance travel.  Has not eaten raw foods, drank unfiltered water.  No history of GI disorders including Crohn's, IBS, ulcerative colitis. Has not yet started Mounjaro .   No current facility-administered medications for this encounter.  Current Outpatient Medications:    amLODipine  (NORVASC ) 5 MG tablet, TAKE 1 TABLET(5 MG) BY MOUTH DAILY, Disp: 90 tablet, Rfl: 1   hydrochlorothiazide  (HYDRODIURIL ) 25 MG tablet, Take 1 tablet (25 mg total) by mouth daily., Disp: 90 tablet, Rfl: 3   hydrOXYzine  (ATARAX ) 25 MG tablet, Take 25-50 mg by mouth at bedtime as needed., Disp: , Rfl:    JUNEL FE 1.5/30 1.5-30 MG-MCG tablet, TAKE 1 TABLET BY MOUTH DAILY, Disp: 84 tablet, Rfl: 4   omeprazole  (PRILOSEC) 40 MG capsule, Take 1 capsule (40 mg total) by mouth daily as needed., Disp: 90 capsule, Rfl: 0   sertraline  (ZOLOFT ) 25 MG tablet, Take 1 tablet (25 mg total) by mouth daily., Disp: , Rfl:    tirzepatide  (MOUNJARO ) 2.5 MG/0.5ML Pen, Inject 2.5 mg into the skin once a week., Disp: 6 mL, Rfl: 0   No Known Allergies  Past Medical History:  Diagnosis Date   Anxiety    Depression      Past Surgical History:  Procedure Laterality Date   right eye sugery Right 02/2021    Family History  Problem Relation Age of Onset   Arthritis Mother    Diabetes Mother    Hyperlipidemia Mother    Hypertension Mother    Depression Mother    Arthritis Maternal Grandmother    Cancer Maternal Grandmother        colon   Hyperlipidemia Maternal Grandmother    Breast cancer  Maternal Aunt     Social History   Tobacco Use   Smoking status: Every Day    Types: Cigarettes    Start date: 08/01/2023   Smokeless tobacco: Never  Vaping Use   Vaping status: Every Day  Substance Use Topics   Alcohol use: Yes    Comment: occasional   Drug use: Yes    Types: Marijuana    ROS   Objective:   Vitals: BP 134/81 (BP Location: Right Arm)   Pulse 98   Temp 98.9 F (37.2 C) (Oral)   Resp 16   SpO2 97%   Physical Exam Constitutional:      General: She is not in acute distress.    Appearance: Normal appearance. She is well-developed. She is not ill-appearing, toxic-appearing or diaphoretic.  HENT:     Head: Normocephalic and atraumatic.     Nose: Nose normal.     Mouth/Throat:     Mouth: Mucous membranes are moist.     Pharynx: Oropharynx is clear.  Eyes:     General: No scleral icterus.       Right eye: No discharge.        Left eye: No discharge.     Extraocular Movements: Extraocular movements intact.     Conjunctiva/sclera: Conjunctivae normal.  Cardiovascular:     Rate and Rhythm: Normal rate.  Pulmonary:     Effort:  Pulmonary effort is normal.  Abdominal:     General: Bowel sounds are increased. There is no distension.     Palpations: Abdomen is soft. There is no mass.     Tenderness: There is generalized abdominal tenderness. There is no right CVA tenderness, left CVA tenderness, guarding or rebound.  Skin:    General: Skin is warm and dry.  Neurological:     General: No focal deficit present.     Mental Status: She is alert and oriented to person, place, and time.  Psychiatric:        Mood and Affect: Mood normal.        Behavior: Behavior normal.        Thought Content: Thought content normal.        Judgment: Judgment normal.    Assessment and Plan :   PDMP not reviewed this encounter.  1. Colitis    No signs of an acute abdomen. Will manage for suspected colitis with supportive care.  Recommended patient hydrate well, eat  light meals and maintain electrolytes.  Will use Zofran  and Imodium  for nausea, vomiting and diarrhea. Counseled patient on potential for adverse effects with medications prescribed/recommended today, ER and return-to-clinic precautions discussed, patient verbalized understanding.    Adolph Hoop, PA-C 05/18/24 1700

## 2024-05-18 NOTE — ED Triage Notes (Signed)
 Pt c/o abd cramps and diarrhea, nausea x 2 days-taking pepto bismolNAD-steady gait

## 2024-05-18 NOTE — Discharge Instructions (Addendum)

## 2024-05-26 ENCOUNTER — Telehealth: Payer: Self-pay

## 2024-05-26 ENCOUNTER — Other Ambulatory Visit (HOSPITAL_BASED_OUTPATIENT_CLINIC_OR_DEPARTMENT_OTHER): Payer: Self-pay

## 2024-05-26 NOTE — Telephone Encounter (Signed)
 Pharmacy Patient Advocate Encounter   Received notification from CoverMyMeds that prior authorization for Mounjaro  2.5MG /0.5ML auto-injectors is required/requested.   Insurance verification completed.   The patient is insured through [facility] .   Per test claim: PA required; PA submitted to above mentioned insurance via CoverMyMeds Key/confirmation #/EOC [facility] Status is pending

## 2024-05-27 ENCOUNTER — Other Ambulatory Visit (HOSPITAL_COMMUNITY): Payer: Self-pay

## 2024-05-27 NOTE — Telephone Encounter (Signed)
 Pharmacy Patient Advocate Encounter  Received notification from [employer] that Prior Authorization for Mounjaro  2.5MG /0.5ML auto-injectors has been APPROVED from 05/26/2024 to 05/26/2025. Unable to obtain price due to refill too soon rejection, last fill date 05/26/2024 next available fill date 06/16/2024   PA #/Case ID/Reference #: [id number]

## 2024-05-28 ENCOUNTER — Other Ambulatory Visit (HOSPITAL_BASED_OUTPATIENT_CLINIC_OR_DEPARTMENT_OTHER): Payer: Self-pay

## 2024-06-26 ENCOUNTER — Other Ambulatory Visit (HOSPITAL_COMMUNITY): Payer: Self-pay

## 2024-07-06 ENCOUNTER — Other Ambulatory Visit (HOSPITAL_COMMUNITY): Payer: Self-pay

## 2024-08-12 ENCOUNTER — Other Ambulatory Visit (HOSPITAL_BASED_OUTPATIENT_CLINIC_OR_DEPARTMENT_OTHER): Payer: Self-pay

## 2024-08-12 ENCOUNTER — Telehealth: Payer: Self-pay | Admitting: Family

## 2024-08-12 ENCOUNTER — Ambulatory Visit: Admitting: Family

## 2024-08-12 VITALS — BP 137/72 | HR 93 | Temp 98.7°F | Resp 16 | Ht 65.0 in | Wt 267.0 lb

## 2024-08-12 DIAGNOSIS — K219 Gastro-esophageal reflux disease without esophagitis: Secondary | ICD-10-CM

## 2024-08-12 DIAGNOSIS — Z7985 Long-term (current) use of injectable non-insulin antidiabetic drugs: Secondary | ICD-10-CM

## 2024-08-12 DIAGNOSIS — F332 Major depressive disorder, recurrent severe without psychotic features: Secondary | ICD-10-CM | POA: Diagnosis not present

## 2024-08-12 DIAGNOSIS — E119 Type 2 diabetes mellitus without complications: Secondary | ICD-10-CM

## 2024-08-12 DIAGNOSIS — Z6841 Body Mass Index (BMI) 40.0 and over, adult: Secondary | ICD-10-CM

## 2024-08-12 DIAGNOSIS — I10 Essential (primary) hypertension: Secondary | ICD-10-CM

## 2024-08-12 DIAGNOSIS — Z7984 Long term (current) use of oral hypoglycemic drugs: Secondary | ICD-10-CM | POA: Diagnosis not present

## 2024-08-12 DIAGNOSIS — R809 Proteinuria, unspecified: Secondary | ICD-10-CM

## 2024-08-12 DIAGNOSIS — Z716 Tobacco abuse counseling: Secondary | ICD-10-CM | POA: Diagnosis not present

## 2024-08-12 LAB — COMPREHENSIVE METABOLIC PANEL WITH GFR
ALT: 17 U/L (ref 0–35)
AST: 16 U/L (ref 0–37)
Albumin: 4 g/dL (ref 3.5–5.2)
Alkaline Phosphatase: 35 U/L — ABNORMAL LOW (ref 39–117)
BUN: 12 mg/dL (ref 6–23)
CO2: 27 meq/L (ref 19–32)
Calcium: 9.5 mg/dL (ref 8.4–10.5)
Chloride: 100 meq/L (ref 96–112)
Creatinine, Ser: 0.74 mg/dL (ref 0.40–1.20)
GFR: 112.72 mL/min (ref >60.00)
Glucose, Bld: 160 mg/dL — ABNORMAL HIGH (ref 70–99)
Potassium: 3.4 meq/L — ABNORMAL LOW (ref 3.5–5.1)
Sodium: 137 meq/L (ref 135–145)
Total Bilirubin: 0.4 mg/dL (ref 0.2–1.2)
Total Protein: 7.6 g/dL (ref 6.0–8.3)

## 2024-08-12 LAB — MICROALBUMIN / CREATININE URINE RATIO
Creatinine,U: 241.9 mg/dL
Microalb Creat Ratio: 9.7 mg/g (ref 0.0–30.0)
Microalb, Ur: 2.3 mg/dL — ABNORMAL HIGH (ref 0.0–1.9)

## 2024-08-12 LAB — HEMOGLOBIN A1C: Hgb A1c MFr Bld: 7.9 % — ABNORMAL HIGH (ref 4.6–6.5)

## 2024-08-12 MED ORDER — TIRZEPATIDE 2.5 MG/0.5ML ~~LOC~~ SOAJ: 2.5000 mg | SUBCUTANEOUS | 0 refills | Status: DC

## 2024-08-12 MED ORDER — TIRZEPATIDE 2.5 MG/0.5ML ~~LOC~~ SOAJ
2.5000 mg | SUBCUTANEOUS | 0 refills | Status: DC
  Filled 2024-08-12 (×2): qty 2, 28d supply, fill #0

## 2024-08-12 MED ORDER — METFORMIN HCL 500 MG PO TABS
500.0000 mg | ORAL_TABLET | Freq: Two times a day (BID) | ORAL | 0 refills | Status: DC
Start: 2024-08-12 — End: 2024-11-17

## 2024-08-12 MED ORDER — AMLODIPINE BESYLATE 5 MG PO TABS: 5.0000 mg | ORAL_TABLET | Freq: Every day | ORAL | 1 refills | Status: AC

## 2024-08-12 MED ORDER — LISINOPRIL 2.5 MG PO TABS
2.5000 mg | ORAL_TABLET | Freq: Every day | ORAL | 0 refills | Status: DC
Start: 2024-08-12 — End: 2024-11-17

## 2024-08-12 MED ORDER — HYDROCHLOROTHIAZIDE 25 MG PO TABS
25.0000 mg | ORAL_TABLET | Freq: Every day | ORAL | 1 refills | Status: AC
Start: 2024-08-12 — End: ?

## 2024-08-12 NOTE — Assessment & Plan Note (Addendum)
 Lab Results  Component Value Date   HGBA1C 8.2 (H) 05/12/2024   HGBA1C 7.6 (H) 02/12/2024   HGBA1C 6.7 (H) 11/12/2023   Lab Results  Component Value Date   LDLCALC 88 11/12/2023   CREATININE 0.70 05/12/2024   Last A1C was above goal. Tolerating mounjaro  but has been out for several weeks. Continue 2.5 mg once daily.  Has seen good weight loss.

## 2024-08-12 NOTE — Telephone Encounter (Signed)
 A1C is a bit better but still high. Add metformin  500mg  bid and work on diet. There is some protein in her urine from the diabetes. I would like her to add lisinopril  to help protect her kidneys. She needs to continue birth control while she is taking lisinopril  as it is not safe to become pregnant on lisinopril . Repeat bmet in 2 weeks.

## 2024-08-12 NOTE — Progress Notes (Signed)
 Subjective:     Patient ID: Paula Gross, female    DOB: 06/18/2001, 23 y.o.   MRN: [id number]  Chief Complaint  Patient presents with   Diabetes    Here for follow up   Hypertension    Here for follow up   Depression    Diabetes  Hypertension  Depression         Discussed the use of AI scribe software for clinical note transcription with the patient, who gave verbal consent to proceed.  History of Present Illness  Paula Gross is a 23 year old female with type 2 diabetes and hypertension who presents for a follow-up visit.  She is managing her type 2 diabetes with Mounjaro  injections. She initially resolved insurance issues for the medication but faced challenges with a refill due to time constraints. She is arranging for it to be shipped through [facility], with her last dose in late July or early August. She reports a slight decrease in appetite since starting the medication. Her last A1c was 8.2% in May, and she is due for an update today.  She is taking amlodipine  5 mg and hydrochlorothiazide  25 mg for hypertension, with a recent refill last month.  She is on sertraline  25 mg for depression and is under psychiatric care. She missed her last appointment due to falling asleep and forgetting, as it was a telehealth session.   She experiences occasional reflux symptoms and takes omeprazole  as needed, with symptoms generally well-controlled.  She smokes and works two jobs, affecting her sleep. She has not slept yet, contributing to her fatigue.     Health Maintenance Due  Topic Date Due   Diabetic kidney evaluation - Urine ACR  Never done   COVID-19 Vaccine (3 - 2024-25 season) 09/01/2023   OPHTHALMOLOGY EXAM  06/05/2024   Cervical Cancer Screening (Pap smear)  07/17/2024   INFLUENZA VACCINE  07/31/2024    Past Medical History:  Diagnosis Date   Anxiety    Depression     Past Surgical History:  Procedure Laterality Date   right eye sugery Right 02/2021     Family History  Problem Relation Age of Onset   Arthritis Mother    Diabetes Mother    Hyperlipidemia Mother    Hypertension Mother    Depression Mother    Arthritis Maternal Grandmother    Cancer Maternal Grandmother        colon   Hyperlipidemia Maternal Grandmother    Breast cancer Maternal Aunt     Social History   Socioeconomic History   Marital status: Single    Spouse name: Not on file   Number of children: Not on file   Years of education: Not on file   Highest education level: 12th grade  Occupational History   Not on file  Tobacco Use   Smoking status: Every Day    Types: Cigarettes    Start date: 08/01/2023   Smokeless tobacco: Never  Vaping Use   Vaping status: Every Day  Substance and Sexual Activity   Alcohol use: Yes    Comment: occasional   Drug use: Yes    Types: Marijuana   Sexual activity: Yes    Partners: Male    Birth control/protection: Pill  Other Topics Concern   Not on file  Social History Narrative   Lives with Mom   Dad ( has rocky relationship with her dad who lives locally)   1 half brother (does not liver with her)  Goes to [employer]   Dog- mutt   Enjoys reading, television, drawing   Social Drivers of Health   Financial Resource Strain: Medium Risk (08/12/2024)   Overall Financial Resource Strain (CARDIA)    Difficulty of Paying Living Expenses: Somewhat hard  Food Insecurity: Food Insecurity Present (08/12/2024)   Hunger Vital Sign    Worried About Running Out of Food in the Last Year: Sometimes true    Ran Out of Food in the Last Year: Sometimes true  Transportation Needs: No Transportation Needs (08/12/2024)   PRAPARE - Administrator, Civil Service (Medical): No    Lack of Transportation (Non-Medical): No  Physical Activity: Inactive (08/12/2024)   Exercise Vital Sign    Days of Exercise per Week: 0 days    Minutes of Exercise per Session: Not on file  Stress: Stress Concern Present  (08/12/2024)   [name] of Occupational Health - Occupational Stress Questionnaire    Feeling of Stress: Very much  Social Connections: Unknown (08/12/2024)   Social Connection and Isolation Panel    Frequency of Communication with Friends and Family: Once a week    Frequency of Social Gatherings with Friends and Family: Patient declined    Attends Religious Services: Never    Database administrator or Organizations: No    Attends Engineer, structural: Not on file    Marital Status: Never married  Intimate Partner Violence: Not on file    Outpatient Medications Prior to Visit  Medication Sig Dispense Refill   hydrOXYzine  (ATARAX ) 25 MG tablet Take 25-50 mg by mouth at bedtime as needed.     JUNEL FE 1.5/30 1.5-30 MG-MCG tablet TAKE 1 TABLET BY MOUTH DAILY 84 tablet 4   loperamide  (IMODIUM ) 2 MG capsule Take 1 capsule (2 mg total) by mouth 2 (two) times daily as needed for diarrhea or loose stools. 14 capsule 0   omeprazole  (PRILOSEC) 40 MG capsule Take 1 capsule (40 mg total) by mouth daily as needed. 90 capsule 0   ondansetron  (ZOFRAN -ODT) 8 MG disintegrating tablet Take 1 tablet (8 mg total) by mouth every 8 (eight) hours as needed for nausea or vomiting. 20 tablet 0   sertraline  (ZOLOFT ) 25 MG tablet Take 1 tablet (25 mg total) by mouth daily.     amLODipine  (NORVASC ) 5 MG tablet TAKE 1 TABLET(5 MG) BY MOUTH DAILY 90 tablet 1   hydrochlorothiazide  (HYDRODIURIL ) 25 MG tablet Take 1 tablet (25 mg total) by mouth daily. 90 tablet 3   tirzepatide  (MOUNJARO ) 2.5 MG/0.5ML Pen Inject 2.5 mg into the skin once a week. 6 mL 0   No facility-administered medications prior to visit.    No Known Allergies  Review of Systems  Psychiatric/Behavioral:  Positive for depression.        Objective:    Physical Exam Constitutional:      General: She is not in acute distress.    Appearance: Normal appearance. She is well-developed.  HENT:     Head: Normocephalic and  atraumatic.     Right Ear: External ear normal.     Left Ear: External ear normal.  Eyes:     General: No scleral icterus. Neck:     Thyroid: No thyromegaly.  Cardiovascular:     Rate and Rhythm: Normal rate and regular rhythm.     Heart sounds: Normal heart sounds. No murmur heard. Pulmonary:     Effort: Pulmonary effort is normal. No respiratory distress.  Breath sounds: Normal breath sounds. No wheezing.  Musculoskeletal:     Cervical back: Neck supple.  Skin:    General: Skin is warm and dry.  Neurological:     Mental Status: She is alert and oriented to person, place, and time.  Psychiatric:        Mood and Affect: Mood normal. Affect is flat.        Behavior: Behavior normal.        Thought Content: Thought content normal.        Judgment: Judgment normal.      BP 137/72 (BP Location: Right Arm, Patient Position: Sitting, Cuff Size: Large)   Pulse 93   Temp 98.7 F (37.1 C) (Oral)   Resp 16   Ht 5' 5 (1.651 m)   Wt 267 lb (121.1 kg)   SpO2 100%   BMI 44.43 kg/m  Wt Readings from Last 3 Encounters:  08/12/24 267 lb (121.1 kg)  05/12/24 276 lb (125.2 kg)  02/12/24 269 lb (122 kg)       Assessment & Plan:   Problem List Items Addressed This Visit       Unprioritized   Tobacco abuse counseling   Pt counseled on importance of quitting.      Severe recurrent major depression without psychotic features (HCC)   Uncontrolled. Advised pt to schedule follow up with psychiatry.      Morbid obesity with BMI of 40.0-44.9, adult (HCC)   Has lost 9 pounds so far with mounjaro . Continue weight loss efforts.       Relevant Medications   tirzepatide  (MOUNJARO ) 2.5 MG/0.5ML Pen   Hypertension   At goal on hydrochlorothiazide  and amlodipine .      Relevant Medications   hydrochlorothiazide  (HYDRODIURIL ) 25 MG tablet   amLODipine  (NORVASC ) 5 MG tablet   Other Relevant Orders   Comp Met (CMET)   GERD (gastroesophageal reflux disease)   Reports symptoms are  stable on omeprazole  prn.       Controlled type 2 diabetes mellitus without complication, without long-term current use of insulin (HCC) - Primary   Lab Results  Component Value Date   HGBA1C 8.2 (H) 05/12/2024   HGBA1C 7.6 (H) 02/12/2024   HGBA1C 6.7 (H) 11/12/2023   Lab Results  Component Value Date   LDLCALC 88 11/12/2023   CREATININE 0.70 05/12/2024   Last A1C was above goal. Tolerating mounjaro  but has been out for several weeks. Continue 2.5 mg once daily.  Has seen good weight loss.      Relevant Medications   tirzepatide  (MOUNJARO ) 2.5 MG/0.5ML Pen   Other Relevant Orders   HgB A1c   Urine Microalbumin w/creat. ratio    I have changed Paula Gross's amLODipine . I am also having her maintain her hydrOXYzine , sertraline , Junel FE 1.5/30, omeprazole , ondansetron , loperamide , hydrochlorothiazide , and tirzepatide .  Meds ordered this encounter  Medications   DISCONTD: tirzepatide  (MOUNJARO ) 2.5 MG/0.5ML Pen    Sig: Inject 2.5 mg into the skin once a week.    Dispense:  6 mL    Refill:  0    Patient no longer has medicaid- please file through [facility].    Supervising Provider:   DOMENICA BLACKBIRD A [[id number]]   hydrochlorothiazide  (HYDRODIURIL ) 25 MG tablet    Sig: Take 1 tablet (25 mg total) by mouth daily.    Dispense:  90 tablet    Refill:  1    Supervising Provider:   DOMENICA BLACKBIRD A [[id number]]   amLODipine  (NORVASC ) 5  MG tablet    Sig: Take 1 tablet (5 mg total) by mouth daily.    Dispense:  90 tablet    Refill:  1    Supervising Provider:   DOMENICA BLACKBIRD A [[id number]]   tirzepatide  (MOUNJARO ) 2.5 MG/0.5ML Pen    Sig: Inject 2.5 mg into the skin once a week.    Dispense:  6 mL    Refill:  0    Patient no longer has medicaid- please file through [place].    Supervising Provider:   DOMENICA BLACKBIRD A [phone]

## 2024-08-12 NOTE — Assessment & Plan Note (Signed)
 Reports symptoms are stable on omeprazole  prn.

## 2024-08-12 NOTE — Assessment & Plan Note (Signed)
 Uncontrolled. Advised pt to schedule follow up with psychiatry.

## 2024-08-12 NOTE — Assessment & Plan Note (Signed)
 Has lost 9 pounds so far with mounjaro . Continue weight loss efforts.

## 2024-08-12 NOTE — Assessment & Plan Note (Signed)
 At goal on hydrochlorothiazide  and amlodipine .

## 2024-08-12 NOTE — Patient Instructions (Signed)
 VISIT SUMMARY:  Today, you had a follow-up visit to manage your type 2 diabetes, hypertension, depression, and other health concerns. We discussed your current medications, recent weight loss, and the need for updated lab work. We also talked about the importance of using backup contraception and the benefits of quitting smoking.  YOUR PLAN:  TYPE 2 DIABETES MELLITUS: Your diabetes is being managed with Mounjaro  injections. Your last A1c was 8.2% in May, and you are due for an update today. -Continue Mounjaro  2.5 mg for another month. -We will send a prescription for Mounjaro  to [facility] for a three-month supply. -Update your A1c today. -Use backup contraception due to potential decreased effectiveness of birth control.  OBESITY: You have lost 9 pounds recently, likely due to Mounjaro , which is also helping to suppress your appetite. -Continue Mounjaro  2.5 mg for another month.  HYPERTENSION: Your blood pressure is well-controlled with your current medications. -Continue taking amlodipine  5 mg and hydrochlorothiazide  25 mg. -We will send prescriptions for amlodipine  and hydrochlorothiazide  to [facility].  DEPRESSIVE DISORDER: Your depression is being managed with sertraline  25 mg, but you missed your last psychiatry appointment. -Schedule a follow-up with psychiatry to discuss a potential adjustment of your sertraline  dosage.  GASTROESOPHAGEAL REFLUX DISEASE: Your reflux symptoms are well-controlled with as-needed omeprazole . -Continue taking omeprazole  as needed.  TOBACCO USE: You are currently smoking, which is influenced by stress. We discussed the financial benefits of quitting as motivation. -Consider the financial savings as motivation to quit smoking.

## 2024-08-12 NOTE — Assessment & Plan Note (Signed)
 Pt counseled on importance of quitting.

## 2024-08-13 NOTE — Telephone Encounter (Signed)
 Scheduled to come back in 2 weeks for bmet

## 2024-08-13 NOTE — Telephone Encounter (Signed)
 Patient notified of results, new medications, provider's comments and recommendations.

## 2024-08-27 ENCOUNTER — Other Ambulatory Visit

## 2024-09-01 ENCOUNTER — Other Ambulatory Visit (INDEPENDENT_AMBULATORY_CARE_PROVIDER_SITE_OTHER)

## 2024-09-01 ENCOUNTER — Ambulatory Visit: Admitting: Family

## 2024-09-01 DIAGNOSIS — R809 Proteinuria, unspecified: Secondary | ICD-10-CM

## 2024-09-01 LAB — BASIC METABOLIC PANEL WITH GFR
BUN: 17 mg/dL (ref 6–23)
CO2: 27 meq/L (ref 19–32)
Calcium: 9 mg/dL (ref 8.4–10.5)
Chloride: 101 meq/L (ref 96–112)
Creatinine, Ser: 0.77 mg/dL (ref 0.40–1.20)
GFR: 107.43 mL/min (ref >60.00)
Glucose, Bld: 129 mg/dL — ABNORMAL HIGH (ref 70–99)
Potassium: 3.2 meq/L — ABNORMAL LOW (ref 3.5–5.1)
Sodium: 139 meq/L (ref 135–145)

## 2024-09-02 ENCOUNTER — Telehealth: Payer: Self-pay | Admitting: Family

## 2024-09-02 DIAGNOSIS — E876 Hypokalemia: Secondary | ICD-10-CM

## 2024-09-02 MED ORDER — POTASSIUM CHLORIDE CRYS ER 20 MEQ PO TBCR: 20.0000 meq | EXTENDED_RELEASE_TABLET | Freq: Every day | ORAL | 3 refills | Status: DC

## 2024-09-02 NOTE — Telephone Encounter (Signed)
 Potassium is a bit low, likely due to side effect of hydrochlorothiazide . I would like her to start Kdur. Take 2 tabs today, then continue one tab once daily. Repeat BMET in 1 week.

## 2024-09-03 NOTE — Telephone Encounter (Signed)
 Called patient but no answer, left voice mail with this information to increase medication and need to call back to set up lab appointment order for BMET entered as future

## 2024-09-07 NOTE — Telephone Encounter (Signed)
 Pt called and labs made aware and lab scheduled

## 2024-09-17 ENCOUNTER — Other Ambulatory Visit (INDEPENDENT_AMBULATORY_CARE_PROVIDER_SITE_OTHER)

## 2024-09-17 ENCOUNTER — Other Ambulatory Visit

## 2024-09-17 DIAGNOSIS — E876 Hypokalemia: Secondary | ICD-10-CM | POA: Diagnosis not present

## 2024-09-17 LAB — BASIC METABOLIC PANEL WITH GFR
BUN: 17 mg/dL (ref 6–23)
CO2: 26 meq/L (ref 19–32)
Calcium: 10.1 mg/dL (ref 8.4–10.5)
Chloride: 98 meq/L (ref 96–112)
Creatinine, Ser: 0.77 mg/dL (ref 0.40–1.20)
GFR: 107.39 mL/min (ref >60.00)
Glucose, Bld: 106 mg/dL — ABNORMAL HIGH (ref 70–99)
Potassium: 3.9 meq/L (ref 3.5–5.1)
Sodium: 133 meq/L — ABNORMAL LOW (ref 135–145)

## 2024-09-19 ENCOUNTER — Ambulatory Visit: Payer: Self-pay | Admitting: Family

## 2024-10-21 ENCOUNTER — Other Ambulatory Visit: Payer: Self-pay

## 2024-10-21 ENCOUNTER — Ambulatory Visit
Admission: RE | Admit: 2024-10-21 | Discharge: 2024-10-21 | Disposition: A | Discharge status: Home / Self Care | Source: Ambulatory Visit | Attending: Family Medicine | Admitting: Family Medicine

## 2024-10-21 VITALS — BP 118/84 | HR 89 | Temp 98.5°F | Resp 18

## 2024-10-21 DIAGNOSIS — B349 Viral infection, unspecified: Secondary | ICD-10-CM | POA: Insufficient documentation

## 2024-10-21 DIAGNOSIS — J029 Acute pharyngitis, unspecified: Secondary | ICD-10-CM | POA: Diagnosis not present

## 2024-10-21 LAB — POC COVID19/FLU A&B COMBO
Covid Antigen, POC: NEGATIVE
Influenza A Antigen, POC: NEGATIVE
Influenza B Antigen, POC: NEGATIVE

## 2024-10-21 LAB — POCT RAPID STREP A (OFFICE): Rapid Strep A Screen: NEGATIVE

## 2024-10-21 NOTE — Discharge Instructions (Signed)
 The clinic will contact you with results of the strep throat culture done today if positive.  You may do salt water gargles and warm liquids such as teas and honey, lots of rest and fluids.  Please follow-up with your PCP if your symptoms do not improve.  Please go to the ER for any worsening symptoms.  Hope you feel better soon!

## 2024-10-21 NOTE — ED Triage Notes (Signed)
 Pt states she had someone look at the back of her throat and they said it was red. Pt c/o int sore throat and [id number].

## 2024-10-21 NOTE — ED Provider Notes (Signed)
 [facility]    CSN: [id number] Arrival date & time: 10/21/24  1120      History   Chief Complaint Chief Complaint  Patient presents with   Sore Throat    HPI Paula Gross is a 23 y.o. female  presents for evaluation of URI symptoms for 3 days. Patient reports associated symptoms of sore throat and cough. Denies N/V/D, fevers, congestion, ear pain, body aches, shortness of breath. Patient does not have a hx of asthma. Patient is not an active smoker.   Reports no known sick contacts but does work with the public.  Pt has taken nothing OTC for symptoms.  Patient states she was treated for strep throat 2 to 3 weeks ago but chart review shows no record of this.  States she completed her antibiotics with resolution of symptoms.  Pt has no other concerns at this time.    Sore Throat    Past Medical History:  Diagnosis Date   Anxiety    Depression     Patient Active Problem List   Diagnosis Date Noted   Tobacco abuse counseling 08/12/2024   Morbid obesity with BMI of 40.0-44.9, adult (HCC) 11/12/2023   Controlled type 2 diabetes mellitus without complication, without long-term current use of insulin (HCC) 09/25/2023   Insomnia 12/04/2022   Hypertension 10/27/2021   GERD (gastroesophageal reflux disease) 10/27/2021   Preventative health care 07/11/2021   Major depressive disorder, recurrent severe without psychotic features (HCC) 08/11/2017   Severe recurrent major depression without psychotic features (HCC) 08/11/2017    Past Surgical History:  Procedure Laterality Date   right eye sugery Right 02/2021    OB History     Gravida  0   Para  0   Term  0   Preterm  0   AB  0   Living  0      SAB  0   IAB  0   Ectopic  0   Multiple  0   Live Births  0            Home Medications    Prior to Admission medications   Medication Sig Start Date End Date Taking? Authorizing Provider  amLODipine  (NORVASC ) 5 MG tablet Take 1 tablet (5 mg  total) by mouth daily. 08/12/24   O'Sullivan, Melissa, NP  hydrochlorothiazide  (HYDRODIURIL ) 25 MG tablet Take 1 tablet (25 mg total) by mouth daily. 08/12/24   O'Sullivan, Melissa, NP  hydrOXYzine  (ATARAX ) 25 MG tablet Take 25-50 mg by mouth at bedtime as needed. 06/06/22   [provider]  JUNEL FE 1.5/30 1.5-30 MG-MCG tablet TAKE 1 TABLET BY MOUTH DAILY 12/26/23   Webb, Padonda B, FNP  lisinopril  (ZESTRIL ) 2.5 MG tablet Take 1 tablet (2.5 mg total) by mouth daily. 08/12/24   O'Sullivan, Melissa, NP  loperamide  (IMODIUM ) 2 MG capsule Take 1 capsule (2 mg total) by mouth 2 (two) times daily as needed for diarrhea or loose stools. 05/18/24   Christopher Savannah, PA-C  metFORMIN  (GLUCOPHAGE ) 500 MG tablet Take 1 tablet (500 mg total) by mouth 2 (two) times daily with a meal. 08/12/24   Daryl Setter, NP  omeprazole  (PRILOSEC) 40 MG capsule Take 1 capsule (40 mg total) by mouth daily as needed. 03/30/24   O'Sullivan, Melissa, NP  ondansetron  (ZOFRAN -ODT) 8 MG disintegrating tablet Take 1 tablet (8 mg total) by mouth every 8 (eight) hours as needed for nausea or vomiting. 05/18/24   Christopher Savannah, PA-C  potassium chloride  SA (  KLOR-CON  M) 20 MEQ tablet Take 1 tablet (20 mEq total) by mouth daily. 09/02/24   O'Sullivan, Melissa, NP  sertraline  (ZOLOFT ) 25 MG tablet Take 1 tablet (25 mg total) by mouth daily. 09/25/23   O'Sullivan, Melissa, NP  tirzepatide  (MOUNJARO ) 2.5 MG/0.5ML Pen Inject 2.5 mg into the skin once a week. 08/12/24   Daryl Setter, NP    Family History Family History  Problem Relation Age of Onset   Arthritis Mother    Diabetes Mother    Hyperlipidemia Mother    Hypertension Mother    Depression Mother    Arthritis Maternal Grandmother    Cancer Maternal Grandmother        colon   Hyperlipidemia Maternal Grandmother    Breast cancer Maternal Aunt     Social History Social History   Tobacco Use   Smoking status: Every Day    Types: Cigarettes    Start date: 08/01/2023    Smokeless tobacco: Never  Vaping Use   Vaping status: Every Day  Substance Use Topics   Alcohol use: Yes    Comment: occasional   Drug use: Yes    Types: Marijuana     Allergies   Patient has no known allergies.   Review of Systems Review of Systems  HENT:  Positive for sore throat.   Respiratory:  Positive for cough.      Physical Exam Triage Vital Signs ED Triage Vitals  Encounter Vitals Group     BP 10/21/24 1156 118/84     Girls Systolic BP Percentile --      Girls Diastolic BP Percentile --      Boys Systolic BP Percentile --      Boys Diastolic BP Percentile --      Pulse Rate 10/21/24 1156 89     Resp 10/21/24 1156 18     Temp 10/21/24 1156 98.5 F (36.9 C)     Temp Source 10/21/24 1156 Oral     SpO2 10/21/24 1156 97 %     Weight --      Height --      Head Circumference --      Peak Flow --      Pain Score 10/21/24 1154 0     Pain Loc --      Pain Education --      Exclude from Growth Chart --    No data found.  Updated Vital Signs BP 118/84   Pulse 89   Temp 98.5 F (36.9 C) (Oral)   Resp 18   LMP 10/07/2024   SpO2 97%   Visual Acuity Right Eye Distance:   Left Eye Distance:   Bilateral Distance:    Right Eye Near:   Left Eye Near:    Bilateral Near:     Physical Exam Vitals and nursing note reviewed.  Constitutional:      General: She is not in acute distress.    Appearance: She is well-developed. She is not ill-appearing.  HENT:     Head: Normocephalic and atraumatic.     Right Ear: Tympanic membrane and ear canal normal.     Left Ear: Tympanic membrane and ear canal normal.     Nose: Congestion present.     Mouth/Throat:     Mouth: Mucous membranes are moist.     Pharynx: Oropharynx is clear. Uvula midline. Posterior oropharyngeal erythema present.     Tonsils: No tonsillar exudate or tonsillar abscesses.  Eyes:     Conjunctiva/sclera: Conjunctivae  normal.     Pupils: Pupils are equal, round, and reactive to light.   Cardiovascular:     Rate and Rhythm: Normal rate and regular rhythm.     Heart sounds: Normal heart sounds.  Pulmonary:     Effort: Pulmonary effort is normal.     Breath sounds: Normal breath sounds.  Musculoskeletal:     Cervical back: Normal range of motion and neck supple.  Lymphadenopathy:     Cervical: No cervical adenopathy.  Skin:    General: Skin is warm and dry.  Neurological:     General: No focal deficit present.     Mental Status: She is alert and oriented to person, place, and time.  Psychiatric:        Mood and Affect: Mood normal.        Behavior: Behavior normal.      UC Treatments / Results  Labs (all labs ordered are listed, but only abnormal results are displayed) Labs Reviewed  CULTURE, GROUP A STREP [facility])  POCT RAPID STREP A (OFFICE)  POC COVID19/FLU A&B COMBO    EKG   Radiology No results found.  Procedures Procedures (including critical care time)  Medications Ordered in UC Medications - No data to display  Initial Impression / Assessment and Plan / UC Course  I have reviewed the triage vital signs and the nursing notes.  Pertinent labs & imaging results that were available during my care of the patient were reviewed by me and considered in my medical decision making (see chart for details).     Reviewed exam and symptoms with patient.  No red flags.  Negative rapid strep COVID and flu testing.  Will send strep throat culture.  Discussed viral illness and symptomatic treatment.  PCP follow-up if symptoms do not improve.  ER precautions reviewed Final Clinical Impressions(s) / UC Diagnoses   Final diagnoses:  Sore throat  Viral illness     Discharge Instructions      The clinic will contact you with results of the strep throat culture done today if positive.  You may do salt water gargles and warm liquids such as teas and honey, lots of rest and fluids.  Please follow-up with your PCP if your symptoms do not improve.  Please go  to the ER for any worsening symptoms.  Hope you feel better soon!     ED Prescriptions   None    PDMP not reviewed this encounter.   Loreda Myla SAUNDERS, NP 10/21/24 1258

## 2024-10-24 LAB — CULTURE, GROUP A STREP (THRC)

## 2024-10-26 ENCOUNTER — Ambulatory Visit (HOSPITAL_COMMUNITY): Payer: Self-pay

## 2024-11-17 ENCOUNTER — Other Ambulatory Visit (HOSPITAL_COMMUNITY)
Admission: RE | Admit: 2024-11-17 | Discharge: 2024-11-17 | Disposition: A | Discharge status: Home / Self Care | Source: Ambulatory Visit | Attending: Family | Admitting: Family

## 2024-11-17 ENCOUNTER — Ambulatory Visit: Admitting: Family

## 2024-11-17 VITALS — BP 129/80 | HR 94 | Temp 98.8°F | Resp 16 | Ht 65.0 in | Wt 260.0 lb

## 2024-11-17 DIAGNOSIS — Z23 Encounter for immunization: Secondary | ICD-10-CM

## 2024-11-17 DIAGNOSIS — Z01419 Encounter for gynecological examination (general) (routine) without abnormal findings: Secondary | ICD-10-CM | POA: Diagnosis present

## 2024-11-17 DIAGNOSIS — E119 Type 2 diabetes mellitus without complications: Secondary | ICD-10-CM

## 2024-11-17 DIAGNOSIS — Z3040 Encounter for surveillance of contraceptives, unspecified: Secondary | ICD-10-CM | POA: Insufficient documentation

## 2024-11-17 DIAGNOSIS — Z7985 Long-term (current) use of injectable non-insulin antidiabetic drugs: Secondary | ICD-10-CM | POA: Diagnosis not present

## 2024-11-17 DIAGNOSIS — Z7984 Long term (current) use of oral hypoglycemic drugs: Secondary | ICD-10-CM

## 2024-11-17 DIAGNOSIS — Z113 Encounter for screening for infections with a predominantly sexual mode of transmission: Secondary | ICD-10-CM

## 2024-11-17 DIAGNOSIS — E876 Hypokalemia: Secondary | ICD-10-CM | POA: Diagnosis not present

## 2024-11-17 DIAGNOSIS — I10 Essential (primary) hypertension: Secondary | ICD-10-CM

## 2024-11-17 DIAGNOSIS — R809 Proteinuria, unspecified: Secondary | ICD-10-CM

## 2024-11-17 LAB — LIPID PANEL
Cholesterol: 156 mg/dL (ref 0–200)
HDL: 33.3 mg/dL — ABNORMAL LOW (ref >39.00)
LDL Cholesterol: 92 mg/dL (ref 0–99)
NonHDL: 122.35
Total CHOL/HDL Ratio: 5
Triglycerides: 154 mg/dL — ABNORMAL HIGH (ref 0.0–149.0)
VLDL: 30.8 mg/dL (ref 0.0–40.0)

## 2024-11-17 LAB — HEMOGLOBIN A1C: Hgb A1c MFr Bld: 6.6 % — ABNORMAL HIGH (ref 4.6–6.5)

## 2024-11-17 MED ORDER — NORETHIN ACE-ETH ESTRAD-FE 1.5-30 MG-MCG PO TABS: 1.0000 | ORAL_TABLET | Freq: Every day | ORAL | 4 refills | Status: AC

## 2024-11-17 MED ORDER — TIRZEPATIDE 5 MG/0.5ML ~~LOC~~ SOAJ: 5.0000 mg | SUBCUTANEOUS | 0 refills | Status: DC

## 2024-11-17 MED ORDER — FREESTYLE LIBRE 3 PLUS SENSOR MISC: 3 refills | Status: AC

## 2024-11-17 MED ORDER — METFORMIN HCL 500 MG PO TABS: 500.0000 mg | ORAL_TABLET | Freq: Two times a day (BID) | ORAL | 1 refills | Status: AC

## 2024-11-17 MED ORDER — LISINOPRIL 2.5 MG PO TABS: 2.5000 mg | ORAL_TABLET | Freq: Every day | ORAL | 1 refills | Status: AC

## 2024-11-17 MED ORDER — POTASSIUM CHLORIDE CRYS ER 20 MEQ PO TBCR: 20.0000 meq | EXTENDED_RELEASE_TABLET | Freq: Every day | ORAL | 1 refills | Status: AC

## 2024-11-17 NOTE — Progress Notes (Signed)
 Subjective:     Patient ID: Paula Gross Dys, female    DOB: 2001-07-10, 23 y.o.   MRN: [id number]  Chief Complaint  Patient presents with   Diabetes    Here for follow up   Hypertension    Here for follow up   STD screening    Patient will like to get STD screening, denies any symptoms.     Diabetes  Hypertension    Discussed the use of AI scribe software for clinical note transcription with the patient, who gave verbal consent to proceed.  History of Present Illness Paula Gross is a 23 year old female with diabetes who presents for a routine follow-up and STD screening.  She is on Mounjaro , starting at the lowest dose, and has lost seven pounds since her last visit. Her last A1c was 7.9. She may notice a little nausea, constipation the first week or two after increasing the Mounjaro  dosage. She is also on metformin  for diabetes management.  She takes lisinopril , amlodipine , and hydrochlorothiazide  for hypertension. She uses birth control pills, which cause irregular but light periods lasting three to four days. She uses backup contraception due to potential interactions with Mounjaro .  She desires a full STD screening, including tests for gonorrhea, chlamydia, trichomonas, HIV, hepatitis, syphilis, and herpes.  She has not had an eye exam since her surgery and plans to schedule an appointment for an annual exam due to her diabetes.  Her mood is described as 'not as bad, but not great,' with occasional moodiness that resolves in a couple of days. She is not up to date with psychiatry appointments but believes she has an upcoming appointment.      Health Maintenance Due  Topic Date Due   OPHTHALMOLOGY EXAM  06/05/2024   Cervical Cancer Screening (Pap smear)  07/17/2024   Influenza Vaccine  07/31/2024   COVID-19 Vaccine (3 - 2025-26 season) 08/31/2024   FOOT EXAM  11/11/2024    Past Medical History:  Diagnosis Date   Anxiety    Depression     Past Surgical  History:  Procedure Laterality Date   right eye sugery Right 02/2021    Family History  Problem Relation Age of Onset   Arthritis Mother    Diabetes Mother    Hyperlipidemia Mother    Hypertension Mother    Depression Mother    Arthritis Maternal Grandmother    Cancer Maternal Grandmother        colon   Hyperlipidemia Maternal Grandmother    Breast cancer Maternal Aunt     Social History   Socioeconomic History   Marital status: Single    Spouse name: Not on file   Number of children: Not on file   Years of education: Not on file   Highest education level: 12th grade  Occupational History   Not on file  Tobacco Use   Smoking status: Every Day    Types: Cigarettes    Start date: 08/01/2023   Smokeless tobacco: Never  Vaping Use   Vaping status: Every Day  Substance and Sexual Activity   Alcohol use: Yes    Comment: occasional   Drug use: Yes    Types: Marijuana   Sexual activity: Yes    Partners: Male    Birth control/protection: Pill  Other Topics Concern   Not on file  Social History Narrative   Lives with Mom   Dad ( has rocky relationship with her dad who lives locally)   1  half brother (does not liver with her)   Goes to [employer]   Dog- mutt   Enjoys reading, television, drawing   Social Drivers of Health   Financial Resource Strain: Low Risk  (11/17/2024)   Overall Financial Resource Strain (CARDIA)    Difficulty of Paying Living Expenses: Not hard at all  Food Insecurity: No Food Insecurity (11/17/2024)   Hunger Vital Sign    Worried About Running Out of Food in the Last Year: Never true    Ran Out of Food in the Last Year: Never true  Transportation Needs: No Transportation Needs (11/17/2024)   PRAPARE - Administrator, Civil Service (Medical): No    Lack of Transportation (Non-Medical): No  Physical Activity: Inactive (11/17/2024)   Exercise Vital Sign    Days of Exercise per Week: 0 days    Minutes of  Exercise per Session: Not on file  Stress: Stress Concern Present (11/17/2024)   [name] of Occupational Health - Occupational Stress Questionnaire    Feeling of Stress: To some extent  Social Connections: Unknown (11/17/2024)   Social Connection and Isolation Panel    Frequency of Communication with Friends and Family: Patient declined    Frequency of Social Gatherings with Friends and Family: Patient declined    Attends Religious Services: Never    Database Administrator or Organizations: No    Attends Engineer, Structural: Not on file    Marital Status: Patient declined  Catering Manager Violence: Not on file    Outpatient Medications Prior to Visit  Medication Sig Dispense Refill   amLODipine  (NORVASC ) 5 MG tablet Take 1 tablet (5 mg total) by mouth daily. 90 tablet 1   hydrochlorothiazide  (HYDRODIURIL ) 25 MG tablet Take 1 tablet (25 mg total) by mouth daily. 90 tablet 1   hydrOXYzine  (ATARAX ) 25 MG tablet Take 25-50 mg by mouth at bedtime as needed.     sertraline  (ZOLOFT ) 25 MG tablet Take 1 tablet (25 mg total) by mouth daily.     JUNEL FE 1.5/30 1.5-30 MG-MCG tablet TAKE 1 TABLET BY MOUTH DAILY 84 tablet 4   lisinopril  (ZESTRIL ) 2.5 MG tablet Take 1 tablet (2.5 mg total) by mouth daily. 90 tablet 0   metFORMIN  (GLUCOPHAGE ) 500 MG tablet Take 1 tablet (500 mg total) by mouth 2 (two) times daily with a meal. 180 tablet 0   omeprazole  (PRILOSEC) 40 MG capsule Take 1 capsule (40 mg total) by mouth daily as needed. 90 capsule 0   potassium chloride  SA (KLOR-CON  M) 20 MEQ tablet Take 1 tablet (20 mEq total) by mouth daily. 30 tablet 3   tirzepatide  (MOUNJARO ) 2.5 MG/0.5ML Pen Inject 2.5 mg into the skin once a week. 6 mL 0   loperamide  (IMODIUM ) 2 MG capsule Take 1 capsule (2 mg total) by mouth 2 (two) times daily as needed for diarrhea or loose stools. 14 capsule 0   ondansetron  (ZOFRAN -ODT) 8 MG disintegrating tablet Take 1 tablet (8 mg total) by mouth every 8  (eight) hours as needed for nausea or vomiting. 20 tablet 0   No facility-administered medications prior to visit.    No Known Allergies  ROS See HPI    Objective:    Physical Exam Exam conducted with a chaperone present.  Constitutional:      Appearance: Normal appearance.  Cardiovascular:     Rate and Rhythm: Normal rate.     Pulses: Normal pulses.  Genitourinary:    General:  Normal vulva.     Exam position: Lithotomy position.     Vagina: Normal.     Uterus: Normal.      Adnexa: Right adnexa normal and left adnexa normal.       Right: No mass.         Left: No mass.       Comments: Cervical ectropion noted Skin:    General: Skin is warm and dry.  Neurological:     Mental Status: She is alert.      BP 129/80 (BP Location: Right Arm, Patient Position: Sitting, Cuff Size: Large)   Pulse 94   Temp 98.8 F (37.1 C) (Oral)   Resp 16   Ht 5' 5 (1.651 m)   Wt 260 lb (117.9 kg)   LMP 10/07/2024   SpO2 100%   BMI 43.27 kg/m  Wt Readings from Last 3 Encounters:  11/17/24 260 lb (117.9 kg)  08/12/24 267 lb (121.1 kg)  05/12/24 276 lb (125.2 kg)       Assessment & Plan:   Problem List Items Addressed This Visit       Unprioritized   Hypertension   BP Readings from Last 3 Encounters:  11/17/24 129/80  10/21/24 118/84  08/12/24 137/72   Stable on amlodipine  and hydrochlorothiazide .  Continue same.       Relevant Medications   lisinopril  (ZESTRIL ) 2.5 MG tablet   Encounter for surveillance of contraceptives   Reminded pt that Mounjaro  can decrease effectiveness of OCP and she is using condoms for back up protection.       Relevant Medications   norethindrone-ethinyl estradiol-iron (JUNEL FE 1.5/30) 1.5-30 MG-MCG tablet   Controlled type 2 diabetes mellitus without complication, without long-term current use of insulin (HCC) - Primary   Lab Results  Component Value Date   HGBA1C 7.9 (H) 08/12/2024   HGBA1C 8.2 (H) 05/12/2024   HGBA1C 7.6 (H)  02/12/2024   Lab Results  Component Value Date   MICROALBUR 2.3 (H) 08/12/2024   LDLCALC 88 11/12/2023   CREATININE 0.77 09/17/2024   Continues lisinopril  for renal protection, increase mounjaro  to 5 mg. Continue metformin .   Rx sent to start Freestyle Libre 3 plus.      Relevant Medications   tirzepatide  (MOUNJARO ) 5 MG/0.5ML Pen   Continuous Glucose Sensor (FREESTYLE LIBRE 3 PLUS SENSOR) MISC   metFORMIN  (GLUCOPHAGE ) 500 MG tablet   lisinopril  (ZESTRIL ) 2.5 MG tablet   Other Relevant Orders   HgB A1c   Lipid panel   Other Visit Diagnoses       Hypokalemia       Relevant Medications   potassium chloride  SA (KLOR-CON  M) 20 MEQ tablet     Microalbuminuria       Relevant Medications   lisinopril  (ZESTRIL ) 2.5 MG tablet     Screen for STD (sexually transmitted disease)       Relevant Orders   Cervicovaginal ancillary only( [place])   HepB+HepC+HIV Panel   RPR   Herpes Simplex Virus 1 and 2 (IgG), with Reflex to HSV-2 Inhibition     Encounter for routine gynecological examination with Papanicolaou smear of cervix       Relevant Orders   Cytology - PAP( [place])     Flu shot today.  I have discontinued Darcelle E. Friedt's omeprazole , ondansetron , loperamide , and tirzepatide . I have also changed her Junel FE 1.5/30 to norethindrone-ethinyl estradiol-iron. Additionally, I am having her start on tirzepatide  and FreeStyle Libre 3 Plus Sensor. Lastly,  I am having her maintain her hydrOXYzine , sertraline , hydrochlorothiazide , amLODipine , metFORMIN , potassium chloride  SA, and lisinopril .  Meds ordered this encounter  Medications   tirzepatide  (MOUNJARO ) 5 MG/0.5ML Pen    Sig: Inject 5 mg into the skin once a week.    Dispense:  6 mL    Refill:  0    Supervising Provider:   DOMENICA BLACKBIRD A [[id number]]   Continuous Glucose Sensor (FREESTYLE LIBRE 3 PLUS SENSOR) MISC    Sig: Change sensor every 15 days.    Dispense:  6 each    Refill:  3    Supervising Provider:   DOMENICA BLACKBIRD A [[id number]]   metFORMIN  (GLUCOPHAGE ) 500 MG tablet    Sig: Take 1 tablet (500 mg total) by mouth 2 (two) times daily with a meal.    Dispense:  180 tablet    Refill:  1    Supervising Provider:   DOMENICA BLACKBIRD A [[id number]]   potassium chloride  SA (KLOR-CON  M) 20 MEQ tablet    Sig: Take 1 tablet (20 mEq total) by mouth daily.    Dispense:  90 tablet    Refill:  1    Supervising Provider:   DOMENICA BLACKBIRD A [[id number]]   lisinopril  (ZESTRIL ) 2.5 MG tablet    Sig: Take 1 tablet (2.5 mg total) by mouth daily.    Dispense:  90 tablet    Refill:  1    Supervising Provider:   DOMENICA BLACKBIRD A [[id number]]   norethindrone-ethinyl estradiol-iron (JUNEL FE 1.5/30) 1.5-30 MG-MCG tablet    Sig: Take 1 tablet by mouth daily.    Dispense:  84 tablet    Refill:  4    Supervising Provider:   DOMENICA BLACKBIRD A [[id number]]

## 2024-11-17 NOTE — Assessment & Plan Note (Signed)
 Reminded pt that Mounjaro  can decrease effectiveness of OCP and she is using condoms for back up protection.

## 2024-11-17 NOTE — Assessment & Plan Note (Addendum)
 Lab Results  Component Value Date   HGBA1C 7.9 (H) 08/12/2024   HGBA1C 8.2 (H) 05/12/2024   HGBA1C 7.6 (H) 02/12/2024   Lab Results  Component Value Date   MICROALBUR 2.3 (H) 08/12/2024   LDLCALC 88 11/12/2023   CREATININE 0.77 09/17/2024   Continues lisinopril  for renal protection, increase mounjaro  to 5 mg. Continue metformin .   Rx sent to start [facility] 3 plus.

## 2024-11-17 NOTE — Assessment & Plan Note (Signed)
 BP Readings from Last 3 Encounters:  11/17/24 129/80  10/21/24 118/84  08/12/24 137/72   Stable on amlodipine  and hydrochlorothiazide .  Continue same.

## 2024-11-17 NOTE — Patient Instructions (Addendum)
  VISIT SUMMARY: Today, you came in for a routine follow-up for your diabetes and hypertension, as well as for an STD screening. We discussed your current medications, recent weight loss, and overall health maintenance. We also addressed your mood and contraceptive management.  YOUR PLAN: -TYPE 2 DIABETES MELLITUS: Type 2 diabetes is a condition where your body does not use insulin properly, leading to high blood sugar levels. We increased your Mounjaro  dose to 5 mg weekly, to be taken as two 2.5 mg doses in different sites on the same day until your current supply is exhausted. We also ordered a continuous glucose monitor for you, pending insurance approval. Continue taking metformin  and regularly monitor your blood sugar at home.  -HYPERTENSION: Hypertension, or high blood pressure, is when the force of your blood against your artery walls is too high. Your blood pressure is well-controlled at 129/80 mmHg with your current medications. Continue taking lisinopril , amlodipine , and hydrochlorothiazide .  -MORBID OBESITY: Morbid obesity is a condition where your body mass index (BMI) is 40 or higher, which can increase the risk of other health issues. You have experienced recent weight loss on Mounjaro . Continue taking Mounjaro  as per your diabetes management plan.  -HYPOKALEMIA: Hypokalemia is a condition where you have low levels of potassium in your blood. We have refilled your potassium prescription.  -PROTEINURIA: Proteinuria is the presence of excess protein in your urine, which can be a sign of kidney damage. Continue taking lisinopril  to help protect your kidneys.  -CONTRACEPTIVE MANAGEMENT: We discussed that Mounjaro  may decrease the effectiveness of your birth control pills. Continue using backup contraception such as condoms and we have refilled your Junel Fe prescription.  -DEPRESSION: Depression is a mood disorder that causes persistent feelings of sadness and loss of interest. Your mood  has improved but you still experience occasional moodiness. Please follow up with psychiatry for medication management.  -GENERAL HEALTH MAINTENANCE: You are due for routine screenings and vaccinations. We performed an STD screening, a Pap smear, and administered a flu shot today. Please schedule an eye exam due to your diabetes and consider getting a COVID booster at a major pharmacy.  INSTRUCTIONS: Please follow up with psychiatry for your mood management. Schedule an eye exam as soon as possible. Continue monitoring your blood sugar at home and use backup contraception. Await insurance approval for your continuous glucose monitor.                                      [

## 2024-11-18 LAB — HEPB+HEPC+HIV PANEL
HIV Screen 4th Generation wRfx: NONREACTIVE
Hep B C IgM: NEGATIVE
Hep B Core Total Ab: NEGATIVE
Hep B E Ab: NONREACTIVE
Hep B E Ag: NEGATIVE
Hep B Surface Ab, Qual: NONREACTIVE
Hep C Virus Ab: NONREACTIVE
Hepatitis B Surface Ag: NEGATIVE

## 2024-11-18 LAB — HERPES SIMPLEX VIRUS 1 AND 2 (IGG),REFLEX HSV-2 INHIBITION
HSV 1 IGG,TYPE SPECIFIC AB: 3.07 {index} — ABNORMAL HIGH
HSV 2 IGG,TYPE SPECIFIC AB: <0.9 {index}

## 2024-11-18 LAB — CERVICOVAGINAL ANCILLARY ONLY
Chlamydia: NEGATIVE
Comment: NEGATIVE
Comment: NEGATIVE
Comment: NORMAL
Neisseria Gonorrhea: NEGATIVE
Trichomonas: POSITIVE — AB

## 2024-11-18 LAB — CYTOLOGY - PAP
Adequacy: ABSENT
Diagnosis: NEGATIVE

## 2024-11-18 LAB — RPR: RPR Ser Ql: NONREACTIVE

## 2024-11-19 ENCOUNTER — Encounter: Payer: Self-pay | Admitting: *Deleted

## 2024-11-19 ENCOUNTER — Ambulatory Visit: Payer: Self-pay | Admitting: Family

## 2024-11-19 DIAGNOSIS — E785 Hyperlipidemia, unspecified: Secondary | ICD-10-CM

## 2024-11-19 DIAGNOSIS — A5901 Trichomonal vulvovaginitis: Secondary | ICD-10-CM

## 2024-11-19 MED ORDER — ATORVASTATIN CALCIUM 10 MG PO TABS: 10.0000 mg | ORAL_TABLET | Freq: Every day | ORAL | 1 refills | Status: AC

## 2024-11-19 MED ORDER — METRONIDAZOLE 500 MG PO TABS: 500.0000 mg | ORAL_TABLET | Freq: Two times a day (BID) | ORAL | 0 refills | Status: AC

## 2024-11-19 NOTE — Telephone Encounter (Addendum)
 Can you please advise pt that her lab work shows that she has trichomonas  She should begin metronidazole, notify all partner(s), avoid sexual contact until treatment is complete and use condoms every time. No alcohol while taking metronidazole.   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  Diabetes control has improved significantly and is now at goal.  I would recommend addition of atorvastatin to lower her cholesterol. She needs to stay on birth control while she is on cholesterol medication as it is not safe for pregnancy.   Her pap is normal.

## 2024-11-19 NOTE — Progress Notes (Signed)
 Called and reviewed the message with the patient and also let her know that the medications were both sent to her local pharmacy.

## 2025-02-05 ENCOUNTER — Other Ambulatory Visit: Payer: Self-pay | Admitting: Family

## 2025-02-05 DIAGNOSIS — E119 Type 2 diabetes mellitus without complications: Secondary | ICD-10-CM

## 2025-02-17 ENCOUNTER — Ambulatory Visit: Admitting: Family
